# Patient Record
Sex: Male | Born: 1970 | Race: Black or African American | Hispanic: No
Health system: Southern US, Community
[De-identification: ages and names within clinical notes are randomized; demographics above are authoritative.]

## PROBLEM LIST (undated history)

## (undated) DIAGNOSIS — J9621 Acute and chronic respiratory failure with hypoxia: Secondary | ICD-10-CM

## (undated) DIAGNOSIS — N170 Acute kidney failure with tubular necrosis: Secondary | ICD-10-CM

## (undated) DIAGNOSIS — A419 Sepsis, unspecified organism: Secondary | ICD-10-CM

## (undated) DIAGNOSIS — J189 Pneumonia, unspecified organism: Secondary | ICD-10-CM

## (undated) DIAGNOSIS — J80 Acute respiratory distress syndrome: Secondary | ICD-10-CM

## (undated) DIAGNOSIS — U071 COVID-19: Secondary | ICD-10-CM

## (undated) DIAGNOSIS — R6520 Severe sepsis without septic shock: Secondary | ICD-10-CM

---

## 2020-12-02 ENCOUNTER — Other Ambulatory Visit (HOSPITAL_COMMUNITY): Payer: Medicare Other

## 2020-12-02 ENCOUNTER — Institutional Professional Consult (permissible substitution)
Admission: EM | Admit: 2020-12-02 | Discharge: 2021-01-13 | Disposition: A | Discharge status: Skilled Nursing Facility | Payer: Medicare Other | Attending: Internal Medicine | Admitting: Internal Medicine

## 2020-12-02 DIAGNOSIS — A419 Sepsis, unspecified organism: Secondary | ICD-10-CM | POA: Diagnosis present

## 2020-12-02 DIAGNOSIS — J189 Pneumonia, unspecified organism: Secondary | ICD-10-CM | POA: Diagnosis present

## 2020-12-02 DIAGNOSIS — Z931 Gastrostomy status: Secondary | ICD-10-CM

## 2020-12-02 DIAGNOSIS — K567 Ileus, unspecified: Secondary | ICD-10-CM

## 2020-12-02 DIAGNOSIS — J80 Acute respiratory distress syndrome: Secondary | ICD-10-CM | POA: Diagnosis present

## 2020-12-02 DIAGNOSIS — R1310 Dysphagia, unspecified: Secondary | ICD-10-CM

## 2020-12-02 DIAGNOSIS — U071 COVID-19: Secondary | ICD-10-CM | POA: Diagnosis present

## 2020-12-02 DIAGNOSIS — R58 Hemorrhage, not elsewhere classified: Secondary | ICD-10-CM

## 2020-12-02 DIAGNOSIS — R6520 Severe sepsis without septic shock: Secondary | ICD-10-CM | POA: Diagnosis present

## 2020-12-02 DIAGNOSIS — R509 Fever, unspecified: Secondary | ICD-10-CM

## 2020-12-02 DIAGNOSIS — R0603 Acute respiratory distress: Secondary | ICD-10-CM

## 2020-12-02 DIAGNOSIS — T17908A Unspecified foreign body in respiratory tract, part unspecified causing other injury, initial encounter: Secondary | ICD-10-CM

## 2020-12-02 DIAGNOSIS — N170 Acute kidney failure with tubular necrosis: Secondary | ICD-10-CM | POA: Diagnosis present

## 2020-12-02 DIAGNOSIS — D72829 Elevated white blood cell count, unspecified: Secondary | ICD-10-CM

## 2020-12-02 DIAGNOSIS — I509 Heart failure, unspecified: Secondary | ICD-10-CM

## 2020-12-02 DIAGNOSIS — J9621 Acute and chronic respiratory failure with hypoxia: Secondary | ICD-10-CM | POA: Diagnosis present

## 2020-12-02 HISTORY — DX: Pneumonia, unspecified organism: J18.9

## 2020-12-02 HISTORY — DX: Acute and chronic respiratory failure with hypoxia: J96.21

## 2020-12-02 HISTORY — DX: Acute kidney failure with tubular necrosis: N17.0

## 2020-12-02 HISTORY — DX: Sepsis, unspecified organism: A41.9

## 2020-12-02 HISTORY — DX: COVID-19: U07.1

## 2020-12-02 HISTORY — DX: Sepsis, unspecified organism: R65.20

## 2020-12-02 HISTORY — DX: Acute respiratory distress syndrome: J80

## 2020-12-02 MED ORDER — IOPAMIDOL (ISOVUE-300) INJECTION 61%: 25.0000 mL | Freq: Once | INTRAVENOUS | Status: DC | PRN

## 2020-12-03 DIAGNOSIS — N170 Acute kidney failure with tubular necrosis: Secondary | ICD-10-CM

## 2020-12-03 DIAGNOSIS — J189 Pneumonia, unspecified organism: Secondary | ICD-10-CM | POA: Diagnosis not present

## 2020-12-03 DIAGNOSIS — J80 Acute respiratory distress syndrome: Secondary | ICD-10-CM

## 2020-12-03 DIAGNOSIS — J9621 Acute and chronic respiratory failure with hypoxia: Secondary | ICD-10-CM | POA: Diagnosis not present

## 2020-12-03 DIAGNOSIS — A419 Sepsis, unspecified organism: Secondary | ICD-10-CM

## 2020-12-03 DIAGNOSIS — R6520 Severe sepsis without septic shock: Secondary | ICD-10-CM

## 2020-12-03 DIAGNOSIS — U071 COVID-19: Secondary | ICD-10-CM

## 2020-12-03 LAB — CBC
HCT: 32.8 % — ABNORMAL LOW (ref 39.0–52.0)
Hemoglobin: 10.1 g/dL — ABNORMAL LOW (ref 13.0–17.0)
MCH: 27.3 pg (ref 26.0–34.0)
MCHC: 30.8 g/dL (ref 30.0–36.0)
MCV: 88.6 fL (ref 80.0–100.0)
Platelets: 205 10*3/uL (ref 150–400)
RBC: 3.7 MIL/uL — ABNORMAL LOW (ref 4.22–5.81)
RDW: 16.4 % — ABNORMAL HIGH (ref 11.5–15.5)
WBC: 12.7 10*3/uL — ABNORMAL HIGH (ref 4.0–10.5)
nRBC: 0.2 % (ref 0.0–0.2)

## 2020-12-03 LAB — BLOOD GAS, ARTERIAL
Acid-Base Excess: 2.8 mmol/L — ABNORMAL HIGH (ref 0.0–2.0)
Bicarbonate: 26.6 mmol/L (ref 20.0–28.0)
FIO2: 40
O2 Saturation: 98.8 %
Patient temperature: 36.8
pCO2 arterial: 39.2 mmHg (ref 32.0–48.0)
pH, Arterial: 7.445 (ref 7.350–7.450)
pO2, Arterial: 132 mmHg — ABNORMAL HIGH (ref 83.0–108.0)

## 2020-12-03 LAB — BASIC METABOLIC PANEL
Anion gap: 11 (ref 5–15)
BUN: 31 mg/dL — ABNORMAL HIGH (ref 6–20)
CO2: 26 mmol/L (ref 22–32)
Calcium: 8.9 mg/dL (ref 8.9–10.3)
Chloride: 114 mmol/L — ABNORMAL HIGH (ref 98–111)
Creatinine, Ser: 0.78 mg/dL (ref 0.61–1.24)
GFR, Estimated: >60 mL/min (ref >60)
Glucose, Bld: 260 mg/dL — ABNORMAL HIGH (ref 70–99)
Potassium: 4.1 mmol/L (ref 3.5–5.1)
Sodium: 151 mmol/L — ABNORMAL HIGH (ref 135–145)

## 2020-12-03 LAB — PROTIME-INR
INR: 1.5 — ABNORMAL HIGH (ref 0.8–1.2)
Prothrombin Time: 17.3 seconds — ABNORMAL HIGH (ref 11.4–15.2)

## 2020-12-03 NOTE — Consult Note (Signed)
Pulmonary Critical Care Medicine [facility] [place]  PULMONARY SERVICE  Date of Service: 12/03/2020  PULMONARY CRITICAL CARE CONSULT   Charles Levy.  [id number]  DOB: 08/15/1971   DOA: 12/02/2020  Referring Physician: Carron Curie, MD  HPI: Charles Levy. is a 50 y.o. male seen for follow up of Acute on Chronic Respiratory Failure.  Patient has multiple medical problems including asthma anxiety depression diabetes hepatitis C hypertension schizophrenia who had 2 shots COVID vaccine but did not get the booster was admitted to the hospital with COVID-19.  Patient had worsening of respiratory status and ended up on the ventilator mechanical ventilation prolonged period of time was not able to wean had to have a tracheostomy.  Patient received prone position ventilator and also was sedated with propofol fentanyl for sedation.  Prior to coming here the started with pressure support weaning and transferred to our facility now for further management  Review of Systems:  ROS performed and is unremarkable other than noted above.  PAST MEDICAL HISTORY Past Medical History:  Diagnosis Date  . Anxiety  . Asthma  . Bipolar 1 disorder (HCC)  . Depression  . Diabetes mellitus (HCC)  . Hepatitis B  . Hypertension  . Psychosis (HCC)  . Schizophrenia (HCC)  . Substance abuse (HCC)    PAST SURGICAL HISTORY Past Surgical History:  Procedure Laterality Date  . HX ANKLE SURGERY Right 10/2016  . HX FREE SKIN GRAFT   ALLERGIES No Known Allergies   FAMILY HISTORY Family History  Problem Relation Name Age of Onset  . No Known Problems Father  . Hypertension Mother  . Parkinson's Disease Sister  . Seizures Sister   SOCIAL HISTORY Social History   Tobacco Use  . Smoking status: Current Every Day Smoker  Packs/day: 1.00  Years: 5.00  Pack years: 5.00  Types: E-Cigarette/Mist Inhalation Device, Cigarettes  . Smokeless tobacco: Current User  . Tobacco comment:  Counseling given pt vapes  Substance Use Topics  . Alcohol use: Not Currently  Comment: former alcoholic  . 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 General: Comfortable at this time . Eyes: Grossly normal lids, irises & conjunctiva . ENT: grossly tongue is normal . Neck: no obvious mass . Cardiovascular: S1-S2 normal no gallop or rub . Respiratory: Scattered rhonchi very coarse breath sounds . Abdomen: Soft and nontender . Skin: no rash seen on limited exam . Musculoskeletal: not rigid . Psychiatric:unable to assess . Neurologic: no seizure no involuntary movements         Labs on Admission:  Basic Metabolic Panel: Recent Labs  Lab 12/03/20 0140  NA 151*  K 4.1  CL 114*  CO2 26  GLUCOSE 260*  BUN 31*  CREATININE 0.78  CALCIUM 8.9    Recent Labs  Lab 12/03/20 0140  PHART 7.445  PCO2ART 39.2  PO2ART 132*  HCO3 26.6  O2SAT 98.8    Liver Function Tests: No results for input(s): AST, ALT, ALKPHOS, BILITOT, PROT, ALBUMIN in the last 168 hours. No results for input(s): LIPASE, AMYLASE in the last 168 hours. No results for input(s): AMMONIA in the last 168 hours.  CBC: Recent Labs  Lab 12/03/20 0418  WBC 12.7*  HGB 10.1*  HCT 32.8*  MCV 88.6  PLT 205    Cardiac Enzymes: No results for input(s): CKTOTAL, CKMB, CKMBINDEX, TROPONINI in the last 168 hours.  BNP (last 3 results) No results for input(s): BNP in the last 8760 hours.  ProBNP (last 3 results) No results for input(s): PROBNP in the last 8760 hours.   Radiological Exams on Admission: DG ABDOMEN PEG TUBE LOCATION  Result Date: 12/02/2020 CLINICAL DATA:  Peg placement EXAM: ABDOMEN - 1 VIEW COMPARISON:   None. FINDINGS: Orogastric or nasogastric tube enters the stomach. Peg is in place in the proximal body. Contrast is injected which flows into the fundus. No sign of extravasation. IMPRESSION: Orogastric or nasogastric tube well positioned. Peg tube in place in the proximal body. No sign of extravasation following contrast injection. Electronically Signed   By: Paulina Fusi M.D.   On: 12/02/2020 21:50    Assessment/Plan Active Problems:   Acute on chronic respiratory failure with hypoxia (HCC)   COVID-19 virus infection   Severe sepsis (HCC)   Acute renal failure due to tubular necrosis (HCC)   Healthcare-associated pneumonia   Acute respiratory distress syndrome (ARDS) due to COVID-19 virus (HCC)   1. Acute on chronic respiratory failure hypoxia patient right now is on the ventilator full support and assist control mode plan is going to be to wean FiO2 down and also to wean nonprotocol with trial on pressure support wean. 2. COVID-19 virus infection now is in recovery has residual pulmonary damage noted 3. Severe sepsis and shock resolved hemodynamics are stable we will continue to follow along. 4. Acute renal failure we will continue to follow the patient's labs closely. 5. Healthcare associated pneumonia has been treated with antibiotics 6. ARDS severe pulmonary damage post Covid  I have personally seen and evaluated the patient, evaluated laboratory and imaging results, formulated the assessment and plan and placed orders. The Patient requires high complexity decision making with multiple systems involvement.  Case was discussed on Rounds with the Respiratory Therapy Director and the Respiratory staff Time Spent  [name], MD [facility] Pulmonary Critical Care Medicine Sleep Medicine

## 2020-12-04 LAB — BASIC METABOLIC PANEL
Anion gap: 11 (ref 5–15)
BUN: 31 mg/dL — ABNORMAL HIGH (ref 6–20)
CO2: 26 mmol/L (ref 22–32)
Calcium: 9.3 mg/dL (ref 8.9–10.3)
Chloride: 113 mmol/L — ABNORMAL HIGH (ref 98–111)
Creatinine, Ser: 0.66 mg/dL (ref 0.61–1.24)
GFR, Estimated: >60 mL/min (ref >60)
Glucose, Bld: 138 mg/dL — ABNORMAL HIGH (ref 70–99)
Potassium: 3.3 mmol/L — ABNORMAL LOW (ref 3.5–5.1)
Sodium: 150 mmol/L — ABNORMAL HIGH (ref 135–145)

## 2020-12-05 DIAGNOSIS — J189 Pneumonia, unspecified organism: Secondary | ICD-10-CM | POA: Diagnosis not present

## 2020-12-05 DIAGNOSIS — U071 COVID-19: Secondary | ICD-10-CM | POA: Diagnosis not present

## 2020-12-05 DIAGNOSIS — J9621 Acute and chronic respiratory failure with hypoxia: Secondary | ICD-10-CM | POA: Diagnosis not present

## 2020-12-05 DIAGNOSIS — N170 Acute kidney failure with tubular necrosis: Secondary | ICD-10-CM | POA: Diagnosis not present

## 2020-12-05 LAB — BASIC METABOLIC PANEL
Anion gap: 13 (ref 5–15)
BUN: 30 mg/dL — ABNORMAL HIGH (ref 6–20)
CO2: 26 mmol/L (ref 22–32)
Calcium: 9.2 mg/dL (ref 8.9–10.3)
Chloride: 102 mmol/L (ref 98–111)
Creatinine, Ser: 0.66 mg/dL (ref 0.61–1.24)
GFR, Estimated: >60 mL/min (ref >60)
Glucose, Bld: 288 mg/dL — ABNORMAL HIGH (ref 70–99)
Potassium: 3.2 mmol/L — ABNORMAL LOW (ref 3.5–5.1)
Sodium: 141 mmol/L (ref 135–145)

## 2020-12-05 NOTE — Progress Notes (Signed)
Pulmonary Critical Care Medicine [facility] [place]   PULMONARY CRITICAL CARE SERVICE  PROGRESS NOTE  Date of Service: 12/05/2020  Charles Levy.  [id number]  DOB: [birth year]   DOA: 12/02/2020  Referring Physician: Carron Curie, MD  HPI: Charles Hodgkin. is a 50 y.o. male seen for follow up of Acute on Chronic Respiratory Failure.  Patient at this time is on pressure support mode has been on 28% FiO2 currently on a pressure of 12/7  Medications: Reviewed on Rounds  Physical Exam:  Vitals: Temperature 98.9 pulse 88 respiratory 22 blood pressure is 135/82 saturations 99%  Ventilator Settings on pressure support FiO2 is 28% pressure 12/7  . General: Comfortable at this time . Eyes: Grossly normal lids, irises & conjunctiva . ENT: grossly tongue is normal . Neck: no obvious mass . Cardiovascular: S1 S2 normal no gallop . Respiratory: Scattered rhonchi expansion is equal . Abdomen: soft . Skin: no rash seen on limited exam . Musculoskeletal: not rigid . Psychiatric:unable to assess . Neurologic: no seizure no involuntary movements         Lab Data:   Basic Metabolic Panel: Recent Labs  Lab 12/03/20 0140 12/04/20 0507 12/05/20 0520  NA 151* 150* 141  K 4.1 3.3* 3.2*  CL 114* 113* 102  CO2 26 26 26   GLUCOSE 260* 138* 288*  BUN 31* 31* 30*  CREATININE 0.78 0.66 0.66  CALCIUM 8.9 9.3 9.2    ABG: Recent Labs  Lab 12/03/20 0140  PHART 7.445  PCO2ART 39.2  PO2ART 132*  HCO3 26.6  O2SAT 98.8    Liver Function Tests: No results for input(s): AST, ALT, ALKPHOS, BILITOT, PROT, ALBUMIN in the last 168 hours. No results for input(s): LIPASE, AMYLASE in the last 168 hours. No results for input(s): AMMONIA in the last 168 hours.  CBC: Recent Labs  Lab 12/03/20 0418  WBC 12.7*  HGB 10.1*  HCT 32.8*  MCV 88.6  PLT 205    Cardiac Enzymes: No results for input(s): CKTOTAL, CKMB, CKMBINDEX, TROPONINI in the last 168 hours.  BNP (last 3  results) No results for input(s): BNP in the last 8760 hours.  ProBNP (last 3 results) No results for input(s): PROBNP in the last 8760 hours.  Radiological Exams: No results found.  Assessment/Plan Active Problems:   Acute on chronic respiratory failure with hypoxia (HCC)   COVID-19 virus infection   Severe sepsis (HCC)   Acute renal failure due to tubular necrosis (HCC)   Healthcare-associated pneumonia   Acute respiratory distress syndrome (ARDS) due to COVID-19 virus (HCC)   1. Acute on chronic respiratory failure hypoxia we will try to wean on pressure support continue with pressure of 12/7. 2. COVID-19 virus infection recovery 3. Severe sepsis treated resolving hemodynamics stable 4. Acute renal failure following up on labs 5. Healthcare associated pneumonia treated resolving 6. ARDS patient does have severe pulmonary damage post infection   I have personally seen and evaluated the patient, evaluated laboratory and imaging results, formulated the assessment and plan and placed orders. The Patient requires high complexity decision making with multiple systems involvement.  Rounds were done with the Respiratory Therapy Director and Staff therapists and discussed with nursing staff also.  01/31/21, MD [facility] Pulmonary Critical Care Medicine Sleep Medicine

## 2020-12-06 DIAGNOSIS — J9621 Acute and chronic respiratory failure with hypoxia: Secondary | ICD-10-CM | POA: Diagnosis not present

## 2020-12-06 DIAGNOSIS — J189 Pneumonia, unspecified organism: Secondary | ICD-10-CM | POA: Diagnosis not present

## 2020-12-06 DIAGNOSIS — N170 Acute kidney failure with tubular necrosis: Secondary | ICD-10-CM | POA: Diagnosis not present

## 2020-12-06 DIAGNOSIS — U071 COVID-19: Secondary | ICD-10-CM | POA: Diagnosis not present

## 2020-12-06 LAB — POTASSIUM: Potassium: 3.1 mmol/L — ABNORMAL LOW (ref 3.5–5.1)

## 2020-12-06 NOTE — Progress Notes (Signed)
Pulmonary Critical Care Medicine [facility] [place]   PULMONARY CRITICAL CARE SERVICE  PROGRESS NOTE  Date of Service: 12/06/2020  Charles Levy.  [id number]  DOB: 05-29-1971   DOA: 12/02/2020  Referring Physician: Carron Curie, MD  HPI: Charles Levy. is a 50 y.o. male seen for follow up of Acute on Chronic Respiratory Failure.  Patient is on pressure support FiO2 is 28% pressure of 12/5  Medications: Reviewed on Rounds  Physical Exam:  Vitals: Temperature is 98.5 pulse 90 respiratory 25 blood pressure is 123/85 saturations 99%  Ventilator Settings on pressure support FiO2 is 20% pressure 12/5  . General: Comfortable at this time . Eyes: Grossly normal lids, irises & conjunctiva . ENT: grossly tongue is normal . Neck: no obvious mass . Cardiovascular: S1 S2 normal no gallop . Respiratory: Scattered rhonchi very coarse breath sounds . Abdomen: soft . Skin: no rash seen on limited exam . Musculoskeletal: not rigid . Psychiatric:unable to assess . Neurologic: no seizure no involuntary movements         Lab Data:   Basic Metabolic Panel: Recent Labs  Lab 12/03/20 0140 12/04/20 0507 12/05/20 0520 12/06/20 0602  NA 151* 150* 141  --   K 4.1 3.3* 3.2* 3.1*  CL 114* 113* 102  --   CO2 26 26 26   --   GLUCOSE 260* 138* 288*  --   BUN 31* 31* 30*  --   CREATININE 0.78 0.66 0.66  --   CALCIUM 8.9 9.3 9.2  --     ABG: Recent Labs  Lab 12/03/20 0140  PHART 7.445  PCO2ART 39.2  PO2ART 132*  HCO3 26.6  O2SAT 98.8    Liver Function Tests: No results for input(s): AST, ALT, ALKPHOS, BILITOT, PROT, ALBUMIN in the last 168 hours. No results for input(s): LIPASE, AMYLASE in the last 168 hours. No results for input(s): AMMONIA in the last 168 hours.  CBC: Recent Labs  Lab 12/03/20 0418  WBC 12.7*  HGB 10.1*  HCT 32.8*  MCV 88.6  PLT 205    Cardiac Enzymes: No results for input(s): CKTOTAL, CKMB, CKMBINDEX, TROPONINI in the last 168  hours.  BNP (last 3 results) No results for input(s): BNP in the last 8760 hours.  ProBNP (last 3 results) No results for input(s): PROBNP in the last 8760 hours.  Radiological Exams: No results found.  Assessment/Plan Active Problems:   Acute on chronic respiratory failure with hypoxia (HCC)   COVID-19 virus infection   Severe sepsis (HCC)   Acute renal failure due to tubular necrosis (HCC)   Healthcare-associated pneumonia   Acute respiratory distress syndrome (ARDS) due to COVID-19 virus (HCC)   1. Acute on chronic respiratory failure hypoxia plan is to wean on pressure support 12/5 the goal today will be for about 12 hours of weaning 2. COVID-19 virus infection recovery 3. Severe sepsis treated hemodynamics are stable. 4. Acute renal failure following up on labs 5. Healthcare associated pneumonia treated in resolution 6. ARDS status post COVID-19 pneumonia   I have personally seen and evaluated the patient, evaluated laboratory and imaging results, formulated the assessment and plan and placed orders. The Patient requires high complexity decision making with multiple systems involvement.  Rounds were done with the Respiratory Therapy Director and Staff therapists and discussed with nursing staff also.  14/5, MD [facility] Pulmonary Critical Care Medicine Sleep Medicine

## 2020-12-07 ENCOUNTER — Other Ambulatory Visit (HOSPITAL_COMMUNITY): Payer: Medicare Other

## 2020-12-07 DIAGNOSIS — N170 Acute kidney failure with tubular necrosis: Secondary | ICD-10-CM | POA: Diagnosis not present

## 2020-12-07 DIAGNOSIS — J9621 Acute and chronic respiratory failure with hypoxia: Secondary | ICD-10-CM | POA: Diagnosis not present

## 2020-12-07 DIAGNOSIS — J189 Pneumonia, unspecified organism: Secondary | ICD-10-CM | POA: Diagnosis not present

## 2020-12-07 DIAGNOSIS — U071 COVID-19: Secondary | ICD-10-CM | POA: Diagnosis not present

## 2020-12-07 LAB — POTASSIUM: Potassium: 3.4 mmol/L — ABNORMAL LOW (ref 3.5–5.1)

## 2020-12-07 NOTE — Progress Notes (Signed)
Pulmonary Critical Care Medicine [facility] [place]   PULMONARY CRITICAL CARE SERVICE  PROGRESS NOTE  Date of Service: 12/07/2020  Charles Levy.  [id number]  DOB: Dec 11, 1970   DOA: 12/02/2020  Referring Physician: Carron Curie, MD  HPI: Charles Levy. is a 50 y.o. male seen for follow up of Acute on Chronic Respiratory Failure.  At this time patient is on assist control mode with a goal of 16 hours on pressure support  Medications: Reviewed on Rounds  Physical Exam:  Vitals: Temperature is 99.0 pulse 92 respiratory 34 blood pressure is 122/73 saturations 96%  Ventilator Settings on assist control FiO2 28% tidal volume is 500 PEEP 5  . General: Comfortable at this time . Eyes: Grossly normal lids, irises & conjunctiva . ENT: grossly tongue is normal . Neck: no obvious mass . Cardiovascular: S1 S2 normal no gallop . Respiratory: Scattered rhonchi expansion is equal . Abdomen: soft . Skin: no rash seen on limited exam . Musculoskeletal: not rigid . Psychiatric:unable to assess . Neurologic: no seizure no involuntary movements         Lab Data:   Basic Metabolic Panel: Recent Labs  Lab 12/03/20 0140 12/04/20 0507 12/05/20 0520 12/06/20 0602 12/07/20 0352  NA 151* 150* 141  --   --   K 4.1 3.3* 3.2* 3.1* 3.4*  CL 114* 113* 102  --   --   CO2 26 26 26   --   --   GLUCOSE 260* 138* 288*  --   --   BUN 31* 31* 30*  --   --   CREATININE 0.78 0.66 0.66  --   --   CALCIUM 8.9 9.3 9.2  --   --     ABG: Recent Labs  Lab 12/03/20 0140  PHART 7.445  PCO2ART 39.2  PO2ART 132*  HCO3 26.6  O2SAT 98.8    Liver Function Tests: No results for input(s): AST, ALT, ALKPHOS, BILITOT, PROT, ALBUMIN in the last 168 hours. No results for input(s): LIPASE, AMYLASE in the last 168 hours. No results for input(s): AMMONIA in the last 168 hours.  CBC: Recent Labs  Lab 12/03/20 0418  WBC 12.7*  HGB 10.1*  HCT 32.8*  MCV 88.6  PLT 205    Cardiac  Enzymes: No results for input(s): CKTOTAL, CKMB, CKMBINDEX, TROPONINI in the last 168 hours.  BNP (last 3 results) No results for input(s): BNP in the last 8760 hours.  ProBNP (last 3 results) No results for input(s): PROBNP in the last 8760 hours.  Radiological Exams: DG Abd Portable 1V  Result Date: 12/07/2020 CLINICAL DATA:  Ileus EXAM: PORTABLE ABDOMEN - 1 VIEW COMPARISON:  Five days ago FINDINGS: Diffuse colonic gas which is progressive. The ascending colon measures up to 10 cm in diameter. An enteric tube and percutaneous gastrostomy tube is seen over the stomach. The epigastrium is partially covered. No concerning mass effect or gas collection. IMPRESSION: Findings of colonic ileus with progressive distension since 5 days ago. Electronically Signed   By: 12/09/2020 M.D.   On: 12/07/2020 06:09    Assessment/Plan Active Problems:   Acute on chronic respiratory failure with hypoxia (HCC)   COVID-19 virus infection   Severe sepsis (HCC)   Acute renal failure due to tubular necrosis (HCC)   Healthcare-associated pneumonia   Acute respiratory distress syndrome (ARDS) due to COVID-19 virus (HCC)   1. Acute on chronic respiratory failure with hypoxia we will continue with the weaning protocol for pressure support  16 hours today 2. Severe sepsis hemodynamics are stable 3. COVID-19 virus infection resolution 4. Healthcare associated pneumonia patient's x-ray still showing residual pulmonary damage 5. ARDS secondary to COVID-19 6. Acute renal failure following up on labs   I have personally seen and evaluated the patient, evaluated laboratory and imaging results, formulated the assessment and plan and placed orders. The Patient requires high complexity decision making with multiple systems involvement.  Rounds were done with the Respiratory Therapy Director and Staff therapists and discussed with nursing staff also.  [name], MD [facility] Pulmonary Critical Care  Medicine Sleep Medicine

## 2020-12-08 ENCOUNTER — Other Ambulatory Visit (HOSPITAL_COMMUNITY): Payer: Medicare Other

## 2020-12-08 DIAGNOSIS — J9621 Acute and chronic respiratory failure with hypoxia: Secondary | ICD-10-CM | POA: Diagnosis not present

## 2020-12-08 DIAGNOSIS — J189 Pneumonia, unspecified organism: Secondary | ICD-10-CM | POA: Diagnosis not present

## 2020-12-08 DIAGNOSIS — N170 Acute kidney failure with tubular necrosis: Secondary | ICD-10-CM | POA: Diagnosis not present

## 2020-12-08 DIAGNOSIS — U071 COVID-19: Secondary | ICD-10-CM | POA: Diagnosis not present

## 2020-12-08 LAB — BASIC METABOLIC PANEL
Anion gap: 10 (ref 5–15)
BUN: 12 mg/dL (ref 6–20)
CO2: 25 mmol/L (ref 22–32)
Calcium: 8.3 mg/dL — ABNORMAL LOW (ref 8.9–10.3)
Chloride: 104 mmol/L (ref 98–111)
Creatinine, Ser: 0.6 mg/dL — ABNORMAL LOW (ref 0.61–1.24)
GFR, Estimated: >60 mL/min (ref >60)
Glucose, Bld: 141 mg/dL — ABNORMAL HIGH (ref 70–99)
Potassium: 2.7 mmol/L — CL (ref 3.5–5.1)
Sodium: 139 mmol/L (ref 135–145)

## 2020-12-08 LAB — MAGNESIUM: Magnesium: 1.8 mg/dL (ref 1.7–2.4)

## 2020-12-08 NOTE — Progress Notes (Signed)
Pulmonary Critical Care Medicine [facility] [place]   PULMONARY CRITICAL CARE SERVICE  PROGRESS NOTE  Date of Service: 12/08/2020  Charles Levy.  [id number]  DOB: [birth year]   DOA: 12/02/2020  Referring Physician: Carron Curie, MD  HPI: Charles Manson. is a 50 y.o. male seen for follow up of Acute on Chronic Respiratory Failure.  Patient at this time is on pressure support has been on a pressure of 12/5  Medications: Reviewed on Rounds  Physical Exam:  Vitals: Temperature 98.5 pulse 75 respiratory rate 24 blood pressure is 112/70 saturations 98%  Ventilator Settings on pressure support 12/5  . General: Comfortable at this time . Eyes: Grossly normal lids, irises & conjunctiva . ENT: grossly tongue is normal . Neck: no obvious mass . Cardiovascular: S1 S2 normal no gallop . Respiratory: No rhonchi coarse breath sounds . Abdomen: soft . Skin: no rash seen on limited exam . Musculoskeletal: not rigid . Psychiatric:unable to assess . Neurologic: no seizure no involuntary movements         Lab Data:   Basic Metabolic Panel: Recent Labs  Lab 12/03/20 0140 12/04/20 0507 12/05/20 0520 12/06/20 0602 12/07/20 0352 12/08/20 0427  NA 151* 150* 141  --   --  139  K 4.1 3.3* 3.2* 3.1* 3.4* 2.7*  CL 114* 113* 102  --   --  104  CO2 26 26 26   --   --  25  GLUCOSE 260* 138* 288*  --   --  141*  BUN 31* 31* 30*  --   --  12  CREATININE 0.78 0.66 0.66  --   --  0.60*  CALCIUM 8.9 9.3 9.2  --   --  8.3*  MG  --   --   --   --   --  1.8    ABG: Recent Labs  Lab 12/03/20 0140  PHART 7.445  PCO2ART 39.2  PO2ART 132*  HCO3 26.6  O2SAT 98.8    Liver Function Tests: No results for input(s): AST, ALT, ALKPHOS, BILITOT, PROT, ALBUMIN in the last 168 hours. No results for input(s): LIPASE, AMYLASE in the last 168 hours. No results for input(s): AMMONIA in the last 168 hours.  CBC: Recent Labs  Lab 12/03/20 0418  WBC 12.7*  HGB 10.1*  HCT 32.8*   MCV 88.6  PLT 205    Cardiac Enzymes: No results for input(s): CKTOTAL, CKMB, CKMBINDEX, TROPONINI in the last 168 hours.  BNP (last 3 results) No results for input(s): BNP in the last 8760 hours.  ProBNP (last 3 results) No results for input(s): PROBNP in the last 8760 hours.  Radiological Exams: DG Abd 1 View  Result Date: 12/08/2020 CLINICAL DATA:  Ileus. EXAM: ABDOMEN - 1 VIEW COMPARISON:  12/07/2020. FINDINGS: Persistent colonic distention again noted without significant interim change. Associated small bowel distention may be present. Again these findings are most consistent with ileus. Continued follow-up exam suggested to demonstrate resolution and to exclude bowel obstruction. No free air identified. Degenerative changes lumbar spine and both hips. Pelvic calcifications consistent phleboliths. IMPRESSION: Persistent colonic distention again noted without significant interim change. Associated small bowel distention may be present. Again these findings are most consistent with ileus. Continued follow-up exam suggested to demonstrate resolution and to exclude bowel obstruction. Electronically Signed   By: 12/09/2020  Register   On: 12/08/2020 06:17   DG Abd Portable 1V  Result Date: 12/07/2020 CLINICAL DATA:  Ileus EXAM: PORTABLE ABDOMEN - 1 VIEW COMPARISON:  Five days ago FINDINGS: Diffuse colonic gas which is progressive. The ascending colon measures up to 10 cm in diameter. An enteric tube and percutaneous gastrostomy tube is seen over the stomach. The epigastrium is partially covered. No concerning mass effect or gas collection. IMPRESSION: Findings of colonic ileus with progressive distension since 5 days ago. Electronically Signed   By: Marnee Spring M.D.   On: 12/07/2020 06:09    Assessment/Plan Active Problems:   Acute on chronic respiratory failure with hypoxia (HCC)   COVID-19 virus infection   Severe sepsis (HCC)   Acute renal failure due to tubular necrosis (HCC)    Healthcare-associated pneumonia   Acute respiratory distress syndrome (ARDS) due to COVID-19 virus (HCC)   1. Acute on chronic respiratory failure hypoxia plan is to continue with the weaning on pressure support.  Continue to titrate as tolerated we will continue to follow along. 2. COVID-19 virus infection recovery phase 3. Severe sepsis treated in resolution 4. ATN patient is doing better from a renal perspective 5. Healthcare associated pneumonia treated 6. ARDS secondary to COVID-19   I have personally seen and evaluated the patient, evaluated laboratory and imaging results, formulated the assessment and plan and placed orders. The Patient requires high complexity decision making with multiple systems involvement.  Rounds were done with the Respiratory Therapy Director and Staff therapists and discussed with nursing staff also.  [name], MD [facility] Pulmonary Critical Care Medicine Sleep Medicine

## 2020-12-09 ENCOUNTER — Other Ambulatory Visit (HOSPITAL_COMMUNITY): Payer: Medicare Other

## 2020-12-09 DIAGNOSIS — N170 Acute kidney failure with tubular necrosis: Secondary | ICD-10-CM | POA: Diagnosis not present

## 2020-12-09 DIAGNOSIS — U071 COVID-19: Secondary | ICD-10-CM | POA: Diagnosis not present

## 2020-12-09 DIAGNOSIS — J9621 Acute and chronic respiratory failure with hypoxia: Secondary | ICD-10-CM | POA: Diagnosis not present

## 2020-12-09 DIAGNOSIS — J189 Pneumonia, unspecified organism: Secondary | ICD-10-CM | POA: Diagnosis not present

## 2020-12-09 LAB — POTASSIUM: Potassium: 3.2 mmol/L — ABNORMAL LOW (ref 3.5–5.1)

## 2020-12-09 NOTE — Progress Notes (Signed)
Pulmonary Critical Care Medicine [facility] [place]   PULMONARY CRITICAL CARE SERVICE  PROGRESS NOTE  Date of Service: 12/09/2020  Charles Levy.  AJG:[id number]  DOB: July 26, 1971   DOA: 12/02/2020  Referring Physician: Carron Curie, MD  HPI: Charles Levy. is a 50 y.o. male seen for follow up of Acute on Chronic Respiratory Failure.  Patient is weaning on T collar today's goal is up to 12 hours  Medications: Reviewed on Rounds  Physical Exam:  Vitals: Temperature is 98.7 pulse 80 respiratory rate is 16 blood pressure is 142/83 saturations 100%  Ventilator Settings on T collar with an FiO2 of 28%  . General: Comfortable at this time . Eyes: Grossly normal lids, irises & conjunctiva . ENT: grossly tongue is normal . Neck: no obvious mass . Cardiovascular: S1 S2 normal no gallop . Respiratory: Scattered rhonchi expansion is equal . Abdomen: soft . Skin: no rash seen on limited exam . Musculoskeletal: not rigid . Psychiatric:unable to assess . Neurologic: no seizure no involuntary movements         Lab Data:   Basic Metabolic Panel: Recent Labs  Lab 12/03/20 0140 12/04/20 0507 12/05/20 0520 12/06/20 0602 12/07/20 0352 12/08/20 0427  NA 151* 150* 141  --   --  139  K 4.1 3.3* 3.2* 3.1* 3.4* 2.7*  CL 114* 113* 102  --   --  104  CO2 26 26 26   --   --  25  GLUCOSE 260* 138* 288*  --   --  141*  BUN 31* 31* 30*  --   --  12  CREATININE 0.78 0.66 0.66  --   --  0.60*  CALCIUM 8.9 9.3 9.2  --   --  8.3*  MG  --   --   --   --   --  1.8    ABG: Recent Labs  Lab 12/03/20 0140  PHART 7.445  PCO2ART 39.2  PO2ART 132*  HCO3 26.6  O2SAT 98.8    Liver Function Tests: No results for input(s): AST, ALT, ALKPHOS, BILITOT, PROT, ALBUMIN in the last 168 hours. No results for input(s): LIPASE, AMYLASE in the last 168 hours. No results for input(s): AMMONIA in the last 168 hours.  CBC: Recent Labs  Lab 12/03/20 0418  WBC 12.7*  HGB 10.1*  HCT  32.8*  MCV 88.6  PLT 205    Cardiac Enzymes: No results for input(s): CKTOTAL, CKMB, CKMBINDEX, TROPONINI in the last 168 hours.  BNP (last 3 results) No results for input(s): BNP in the last 8760 hours.  ProBNP (last 3 results) No results for input(s): PROBNP in the last 8760 hours.  Radiological Exams: DG Abd 1 View  Result Date: 12/09/2020 CLINICAL DATA:  Ileus. EXAM: ABDOMEN - 1 VIEW COMPARISON:  12/08/2020. FINDINGS: Gastrostomy tube noted with tip over the stomach. Persistent unchanged colonic distention again noted. Associated small bowel distention may also be present. Again these findings are consistent adynamic ileus. Continued follow-up exam suggested demonstrate resolution in order to exclude bowel obstruction. No free air identified. Pelvic calcifications consistent phleboliths. No acute bony abnormality. IMPRESSION: 1. Gastrostomy tube noted with tip over the stomach. 2. Persistent unchanged colonic distention again noted. Associated small bowel distention may also be present. Again these findings are consistent with adynamic ileus. Electronically Signed   By: 12/10/2020  Register   On: 12/09/2020 05:33   DG Abd 1 View  Result Date: 12/08/2020 CLINICAL DATA:  Ileus. EXAM: ABDOMEN - 1 VIEW COMPARISON:  12/07/2020. FINDINGS: Persistent colonic distention again noted without significant interim change. Associated small bowel distention may be present. Again these findings are most consistent with ileus. Continued follow-up exam suggested to demonstrate resolution and to exclude bowel obstruction. No free air identified. Degenerative changes lumbar spine and both hips. Pelvic calcifications consistent phleboliths. IMPRESSION: Persistent colonic distention again noted without significant interim change. Associated small bowel distention may be present. Again these findings are most consistent with ileus. Continued follow-up exam suggested to demonstrate resolution and to exclude bowel  obstruction. Electronically Signed   By: Maisie Fus  Register   On: 12/08/2020 06:17    Assessment/Plan Active Problems:   Acute on chronic respiratory failure with hypoxia (HCC)   COVID-19 virus infection   Severe sepsis (HCC)   Acute renal failure due to tubular necrosis (HCC)   Healthcare-associated pneumonia   Acute respiratory distress syndrome (ARDS) due to COVID-19 virus (HCC)   1. Acute on chronic respiratory failure hypoxia we will continue with T collar trials titrate oxygen continue pulmonary toilet.  The goal today is 12 hours 2. Severe sepsis resolved hemodynamics are stable. 3. Acute renal failure due to necrosis no change we will continue to follow 4. Healthcare associated pneumonia improving clinically. 5. ARDS secondary to COVID-19 treated 6. Acute renal failure supportive care   I have personally seen and evaluated the patient, evaluated laboratory and imaging results, formulated the assessment and plan and placed orders. The Patient requires high complexity decision making with multiple systems involvement.  Rounds were done with the Respiratory Therapy Director and Staff therapists and discussed with nursing staff also.  [name], MD [facility] Pulmonary Critical Care Medicine Sleep Medicine

## 2020-12-10 DIAGNOSIS — U071 COVID-19: Secondary | ICD-10-CM | POA: Diagnosis not present

## 2020-12-10 DIAGNOSIS — J189 Pneumonia, unspecified organism: Secondary | ICD-10-CM | POA: Diagnosis not present

## 2020-12-10 DIAGNOSIS — J9621 Acute and chronic respiratory failure with hypoxia: Secondary | ICD-10-CM | POA: Diagnosis not present

## 2020-12-10 DIAGNOSIS — N170 Acute kidney failure with tubular necrosis: Secondary | ICD-10-CM | POA: Diagnosis not present

## 2020-12-10 LAB — BASIC METABOLIC PANEL
Anion gap: 10 (ref 5–15)
BUN: 12 mg/dL (ref 6–20)
CO2: 24 mmol/L (ref 22–32)
Calcium: 8.7 mg/dL — ABNORMAL LOW (ref 8.9–10.3)
Chloride: 104 mmol/L (ref 98–111)
Creatinine, Ser: 0.52 mg/dL — ABNORMAL LOW (ref 0.61–1.24)
GFR, Estimated: >60 mL/min (ref >60)
Glucose, Bld: 108 mg/dL — ABNORMAL HIGH (ref 70–99)
Potassium: 3.4 mmol/L — ABNORMAL LOW (ref 3.5–5.1)
Sodium: 138 mmol/L (ref 135–145)

## 2020-12-10 LAB — CBC
HCT: 33.8 % — ABNORMAL LOW (ref 39.0–52.0)
Hemoglobin: 10.6 g/dL — ABNORMAL LOW (ref 13.0–17.0)
MCH: 27.8 pg (ref 26.0–34.0)
MCHC: 31.4 g/dL (ref 30.0–36.0)
MCV: 88.7 fL (ref 80.0–100.0)
Platelets: 344 10*3/uL (ref 150–400)
RBC: 3.81 MIL/uL — ABNORMAL LOW (ref 4.22–5.81)
RDW: 18.6 % — ABNORMAL HIGH (ref 11.5–15.5)
WBC: 7.4 10*3/uL (ref 4.0–10.5)
nRBC: 0 % (ref 0.0–0.2)

## 2020-12-10 NOTE — Progress Notes (Signed)
Pulmonary Critical Care Medicine [facility] [place]   PULMONARY CRITICAL CARE SERVICE  PROGRESS NOTE  Date of Service: 12/10/2020  Charles Levy.  IHK:[id number]  DOB: [birth year]   DOA: 12/02/2020  Referring Physician: Carron Curie, MD  HPI: Charles Hubbert. is a 50 y.o. male seen for follow up of Acute on Chronic Respiratory Failure.  Patient is on pressure support currently on 28% FiO2 goal today is T collar 16 hours  Medications: Reviewed on Rounds  Physical Exam:  Vitals: Temperature is 98.3 pulse 88 respiratory 15 blood pressure is 129/80 saturations 97%  Ventilator Settings on pressure support FiO2 is 28% pressure 12/5  . General: Comfortable at this time . Eyes: Grossly normal lids, irises & conjunctiva . ENT: grossly tongue is normal . Neck: no obvious mass . Cardiovascular: S1 S2 normal no gallop . Respiratory: Scattered rhonchi expansion is equal . Abdomen: soft . Skin: no rash seen on limited exam . Musculoskeletal: not rigid . Psychiatric:unable to assess . Neurologic: no seizure no involuntary movements         Lab Data:   Basic Metabolic Panel: Recent Labs  Lab 12/04/20 0507 12/05/20 0520 12/06/20 0602 12/07/20 0352 12/08/20 0427 12/09/20 0855 12/10/20 0458  NA 150* 141  --   --  139  --  138  K 3.3* 3.2* 3.1* 3.4* 2.7* 3.2* 3.4*  CL 113* 102  --   --  104  --  104  CO2 26 26  --   --  25  --  24  GLUCOSE 138* 288*  --   --  141*  --  108*  BUN 31* 30*  --   --  12  --  12  CREATININE 0.66 0.66  --   --  0.60*  --  0.52*  CALCIUM 9.3 9.2  --   --  8.3*  --  8.7*  MG  --   --   --   --  1.8  --   --     ABG: No results for input(s): PHART, PCO2ART, PO2ART, HCO3, O2SAT in the last 168 hours.  Liver Function Tests: No results for input(s): AST, ALT, ALKPHOS, BILITOT, PROT, ALBUMIN in the last 168 hours. No results for input(s): LIPASE, AMYLASE in the last 168 hours. No results for input(s): AMMONIA in the last 168  hours.  CBC: Recent Labs  Lab 12/10/20 0458  WBC 7.4  HGB 10.6*  HCT 33.8*  MCV 88.7  PLT 344    Cardiac Enzymes: No results for input(s): CKTOTAL, CKMB, CKMBINDEX, TROPONINI in the last 168 hours.  BNP (last 3 results) No results for input(s): BNP in the last 8760 hours.  ProBNP (last 3 results) No results for input(s): PROBNP in the last 8760 hours.  Radiological Exams: DG Abd 1 View  Result Date: 12/09/2020 CLINICAL DATA:  Ileus. EXAM: ABDOMEN - 1 VIEW COMPARISON:  12/08/2020. FINDINGS: Gastrostomy tube noted with tip over the stomach. Persistent unchanged colonic distention again noted. Associated small bowel distention may also be present. Again these findings are consistent adynamic ileus. Continued follow-up exam suggested demonstrate resolution in order to exclude bowel obstruction. No free air identified. Pelvic calcifications consistent phleboliths. No acute bony abnormality. IMPRESSION: 1. Gastrostomy tube noted with tip over the stomach. 2. Persistent unchanged colonic distention again noted. Associated small bowel distention may also be present. Again these findings are consistent with adynamic ileus. Electronically Signed   By: Maisie Fus  Register   On: 12/09/2020 05:33  Assessment/Plan Active Problems:   Acute on chronic respiratory failure with hypoxia (HCC)   COVID-19 virus infection   Severe sepsis (HCC)   Acute renal failure due to tubular necrosis (HCC)   Healthcare-associated pneumonia   Acute respiratory distress syndrome (ARDS) due to COVID-19 virus (HCC)   1. Acute on chronic respiratory failure with hypoxia we will continue with the weaning protocol patient is currently in for 16 hours 2. Severe sepsis and shock hemodynamics are stable. 3. Acute renal failure tubular necrosis need to follow along. 4. COVID-19 virus infection in recovery 5. Healthcare associated pneumonia treated 6. ARDS treated we will continue to follow   I have personally seen  and evaluated the patient, evaluated laboratory and imaging results, formulated the assessment and plan and placed orders. The Patient requires high complexity decision making with multiple systems involvement.  Rounds were done with the Respiratory Therapy Director and Staff therapists and discussed with nursing staff also.  [name], MD [facility] Pulmonary Critical Care Medicine Sleep Medicine

## 2020-12-11 ENCOUNTER — Other Ambulatory Visit (HOSPITAL_COMMUNITY): Payer: Medicare Other

## 2020-12-11 ENCOUNTER — Encounter: Payer: Self-pay | Admitting: Internal Medicine

## 2020-12-11 DIAGNOSIS — J9621 Acute and chronic respiratory failure with hypoxia: Secondary | ICD-10-CM | POA: Diagnosis present

## 2020-12-11 DIAGNOSIS — A419 Sepsis, unspecified organism: Secondary | ICD-10-CM | POA: Diagnosis present

## 2020-12-11 DIAGNOSIS — U071 COVID-19: Secondary | ICD-10-CM | POA: Diagnosis present

## 2020-12-11 DIAGNOSIS — J80 Acute respiratory distress syndrome: Secondary | ICD-10-CM | POA: Diagnosis present

## 2020-12-11 DIAGNOSIS — N170 Acute kidney failure with tubular necrosis: Secondary | ICD-10-CM | POA: Diagnosis present

## 2020-12-11 DIAGNOSIS — J189 Pneumonia, unspecified organism: Secondary | ICD-10-CM | POA: Diagnosis present

## 2020-12-11 LAB — CBC
HCT: 30.8 % — ABNORMAL LOW (ref 39.0–52.0)
Hemoglobin: 10 g/dL — ABNORMAL LOW (ref 13.0–17.0)
MCH: 28.3 pg (ref 26.0–34.0)
MCHC: 32.5 g/dL (ref 30.0–36.0)
MCV: 87.3 fL (ref 80.0–100.0)
Platelets: 358 10*3/uL (ref 150–400)
RBC: 3.53 MIL/uL — ABNORMAL LOW (ref 4.22–5.81)
RDW: 19.2 % — ABNORMAL HIGH (ref 11.5–15.5)
WBC: 7.1 10*3/uL (ref 4.0–10.5)
nRBC: 0 % (ref 0.0–0.2)

## 2020-12-11 LAB — COMPREHENSIVE METABOLIC PANEL
ALT: 32 U/L (ref 0–44)
AST: 32 U/L (ref 15–41)
Albumin: 2.2 g/dL — ABNORMAL LOW (ref 3.5–5.0)
Alkaline Phosphatase: 106 U/L (ref 38–126)
Anion gap: 12 (ref 5–15)
BUN: 8 mg/dL (ref 6–20)
CO2: 22 mmol/L (ref 22–32)
Calcium: 8.5 mg/dL — ABNORMAL LOW (ref 8.9–10.3)
Chloride: 104 mmol/L (ref 98–111)
Creatinine, Ser: 0.5 mg/dL — ABNORMAL LOW (ref 0.61–1.24)
GFR, Estimated: >60 mL/min (ref >60)
Glucose, Bld: 145 mg/dL — ABNORMAL HIGH (ref 70–99)
Potassium: 3.7 mmol/L (ref 3.5–5.1)
Sodium: 138 mmol/L (ref 135–145)
Total Bilirubin: 0.7 mg/dL (ref 0.3–1.2)
Total Protein: 5.6 g/dL — ABNORMAL LOW (ref 6.5–8.1)

## 2020-12-11 NOTE — Progress Notes (Signed)
Pulmonary Critical Care Medicine [facility] [place]   PULMONARY CRITICAL CARE SERVICE  PROGRESS NOTE  Date of Service: 12/11/2020  [name]  VOJ:[id number]  DOB: December 27, 1970   DOA: 12/02/2020  Referring Physician: Carron Curie, MD  HPI: Bernabe Dorce. is a 50 y.o. male seen for follow up of Acute on Chronic Respiratory Failure.  Patient is currently on T collar today the goal is for 20 hours  Medications: Reviewed on Rounds  Physical Exam:  Vitals: Temperature is 97.3 pulse 96 respiratory 27 blood pressure is 158/87 saturations 99%  Ventilator Settings T collar 28%  . General: Comfortable at this time . Eyes: Grossly normal lids, irises & conjunctiva . ENT: grossly tongue is normal . Neck: no obvious mass . Cardiovascular: S1 S2 normal no gallop . Respiratory: No rhonchi no rales are noted at this time . Abdomen: soft . Skin: no rash seen on limited exam . Musculoskeletal: not rigid . Psychiatric:unable to assess . Neurologic: no seizure no involuntary movements         Lab Data:   Basic Metabolic Panel: Recent Labs  Lab 12/05/20 0520 12/06/20 0602 12/07/20 0352 12/08/20 0427 12/09/20 0855 12/10/20 0458 12/11/20 0544  NA 141  --   --  139  --  138 138  K 3.2*   < > 3.4* 2.7* 3.2* 3.4* 3.7  CL 102  --   --  104  --  104 104  CO2 26  --   --  25  --  24 22  GLUCOSE 288*  --   --  141*  --  108* 145*  BUN 30*  --   --  12  --  12 8  CREATININE 0.66  --   --  0.60*  --  0.52* 0.50*  CALCIUM 9.2  --   --  8.3*  --  8.7* 8.5*  MG  --   --   --  1.8  --   --   --    < > = values in this interval not displayed.    ABG: No results for input(s): PHART, PCO2ART, PO2ART, HCO3, O2SAT in the last 168 hours.  Liver Function Tests: Recent Labs  Lab 12/11/20 0544  AST 32  ALT 32  ALKPHOS 106  BILITOT 0.7  PROT 5.6*  ALBUMIN 2.2*   No results for input(s): LIPASE, AMYLASE in the last 168 hours. No results for input(s): AMMONIA in the  last 168 hours.  CBC: Recent Labs  Lab 12/10/20 0458 12/11/20 0544  WBC 7.4 7.1  HGB 10.6* 10.0*  HCT 33.8* 30.8*  MCV 88.7 87.3  PLT 344 358    Cardiac Enzymes: No results for input(s): CKTOTAL, CKMB, CKMBINDEX, TROPONINI in the last 168 hours.  BNP (last 3 results) No results for input(s): BNP in the last 8760 hours.  ProBNP (last 3 results) No results for input(s): PROBNP in the last 8760 hours.  Radiological Exams: DG Abd Portable 1V  Result Date: 12/11/2020 CLINICAL DATA:  Abdominal distension. EXAM: PORTABLE ABDOMEN - 1 VIEW COMPARISON:  12/09/2020 FINDINGS: Gastrostomy tube is again noted within the upper abdomen. Gaseous distension of the colon is stable to improved in the interval. No new findings. IMPRESSION: Stable to improved gaseous distension of the colon compatible with colonic ileus. No new findings. Electronically Signed   By: Signa Kell M.D.   On: 12/11/2020 07:48    Assessment/Plan Active Problems:   Acute on chronic respiratory failure with hypoxia (HCC)  COVID-19 virus infection   Severe sepsis (HCC)   Acute renal failure due to tubular necrosis (HCC)   Healthcare-associated pneumonia   Acute respiratory distress syndrome (ARDS) due to COVID-19 virus (HCC)   1. Acute on chronic respiratory failure hypoxia we will continue with the wean on T collar trials patient is on 28% FiO2. 2. Code 18 virus infection recovery phase we will continue to follow 3. Severe sepsis treated resolved 4. Healthcare associated pneumonia improving chest x-ray showing stable changes. 5. ARDS secondary to COVID-19 continue to monitor. 6. Acute renal failure supportive care   I have personally seen and evaluated the patient, evaluated laboratory and imaging results, formulated the assessment and plan and placed orders. The Patient requires high complexity decision making with multiple systems involvement.  Rounds were done with the Respiratory Therapy Director and Staff  therapists and discussed with nursing staff also.  [name], MD [facility] Pulmonary Critical Care Medicine Sleep Medicine

## 2020-12-12 ENCOUNTER — Other Ambulatory Visit (HOSPITAL_COMMUNITY): Payer: Medicare Other

## 2020-12-12 DIAGNOSIS — N170 Acute kidney failure with tubular necrosis: Secondary | ICD-10-CM | POA: Diagnosis not present

## 2020-12-12 DIAGNOSIS — J189 Pneumonia, unspecified organism: Secondary | ICD-10-CM | POA: Diagnosis not present

## 2020-12-12 DIAGNOSIS — J9621 Acute and chronic respiratory failure with hypoxia: Secondary | ICD-10-CM | POA: Diagnosis not present

## 2020-12-12 DIAGNOSIS — U071 COVID-19: Secondary | ICD-10-CM | POA: Diagnosis not present

## 2020-12-12 NOTE — Progress Notes (Signed)
Pulmonary Critical Care Medicine [facility] [place]   PULMONARY CRITICAL CARE SERVICE  PROGRESS NOTE  Date of Service: 12/12/2020  Charles Genera.  HAL:[id number]  DOB: March 25, 1971   DOA: 12/02/2020  Referring Physician: Carron Curie, MD  HPI: Charles Roussel. is a 50 y.o. male seen for follow up of Acute on Chronic Respiratory Failure.  Currently is off the ventilator on T collar today will be going for 24 hours  Medications: Reviewed on Rounds  Physical Exam:  Vitals: Temperature 98.2 pulse 93 respiratory 29 blood pressure is 139/70 saturations 97%  Ventilator Settings on T collar FiO2 28%  . General: Comfortable at this time . Eyes: Grossly normal lids, irises & conjunctiva . ENT: grossly tongue is normal . Neck: no obvious mass . Cardiovascular: S1 S2 normal no gallop . Respiratory: No rhonchi very coarse breath sounds . Abdomen: soft . Skin: no rash seen on limited exam . Musculoskeletal: not rigid . Psychiatric:unable to assess . Neurologic: no seizure no involuntary movements         Lab Data:   Basic Metabolic Panel: Recent Labs  Lab 12/07/20 0352 12/08/20 0427 12/09/20 0855 12/10/20 0458 12/11/20 0544  NA  --  139  --  138 138  K 3.4* 2.7* 3.2* 3.4* 3.7  CL  --  104  --  104 104  CO2  --  25  --  24 22  GLUCOSE  --  141*  --  108* 145*  BUN  --  12  --  12 8  CREATININE  --  0.60*  --  0.52* 0.50*  CALCIUM  --  8.3*  --  8.7* 8.5*  MG  --  1.8  --   --   --     ABG: No results for input(s): PHART, PCO2ART, PO2ART, HCO3, O2SAT in the last 168 hours.  Liver Function Tests: Recent Labs  Lab 12/11/20 0544  AST 32  ALT 32  ALKPHOS 106  BILITOT 0.7  PROT 5.6*  ALBUMIN 2.2*   No results for input(s): LIPASE, AMYLASE in the last 168 hours. No results for input(s): AMMONIA in the last 168 hours.  CBC: Recent Labs  Lab 12/10/20 0458 12/11/20 0544  WBC 7.4 7.1  HGB 10.6* 10.0*  HCT 33.8* 30.8*  MCV 88.7 87.3  PLT 344 358     Cardiac Enzymes: No results for input(s): CKTOTAL, CKMB, CKMBINDEX, TROPONINI in the last 168 hours.  BNP (last 3 results) No results for input(s): BNP in the last 8760 hours.  ProBNP (last 3 results) No results for input(s): PROBNP in the last 8760 hours.  Radiological Exams: DG Abd 1 View  Result Date: 12/11/2020 CLINICAL DATA:  Ileus EXAM: ABDOMEN - 1 VIEW COMPARISON:  December 11, 2020 at 6:31 a.m. FINDINGS: The patient SPECT tube remains. Stable colonic prominence/dilatation. No other acute abnormalities. IMPRESSION: Stable colonic ileus. Electronically Signed   By: Gerome Sam III M.D   On: 12/11/2020 14:51   DG Abd Portable 1V  Result Date: 12/12/2020 CLINICAL DATA:  Abdominal distension/ileus. EXAM: PORTABLE ABDOMEN - 1 VIEW COMPARISON:  Yesterday FINDINGS: Diffuse gaseous distension colon with some stool seen along the flanks. Maximal colonic diameter is 8 cm at the distal transverse colon. No concerning mass effect or calcification. IMPRESSION: Continued colonic ileus pattern with similar degree of distension. Electronically Signed   By: Marnee Spring M.D.   On: 12/12/2020 07:57   DG Abd Portable 1V  Result Date: 12/11/2020 CLINICAL DATA:  Abdominal distension. EXAM: PORTABLE ABDOMEN - 1 VIEW COMPARISON:  12/09/2020 FINDINGS: Gastrostomy tube is again noted within the upper abdomen. Gaseous distension of the colon is stable to improved in the interval. No new findings. IMPRESSION: Stable to improved gaseous distension of the colon compatible with colonic ileus. No new findings. Electronically Signed   By: Signa Kell M.D.   On: 12/11/2020 07:48    Assessment/Plan Active Problems:   Acute on chronic respiratory failure with hypoxia (HCC)   COVID-19 virus infection   Severe sepsis (HCC)   Acute renal failure due to tubular necrosis (HCC)   Healthcare-associated pneumonia   Acute respiratory distress syndrome (ARDS) due to COVID-19 virus (HCC)   1. Acute on  chronic respiratory failure hypoxia we will continue with the T collar titrate oxygen continue pulmonary toilet 2. COVID-19 virus infection recovery 3. Severe sepsis treated resolving 4. Acute renal failure patient's renal function is normalized 5. Healthcare associated pneumonia treated 6. ARDS slow to improve   I have personally seen and evaluated the patient, evaluated laboratory and imaging results, formulated the assessment and plan and placed orders. The Patient requires high complexity decision making with multiple systems involvement.  Rounds were done with the Respiratory Therapy Director and Staff therapists and discussed with nursing staff also.  [name], MD [facility] Pulmonary Critical Care Medicine Sleep Medicine

## 2020-12-13 ENCOUNTER — Other Ambulatory Visit (HOSPITAL_COMMUNITY): Payer: Medicare Other

## 2020-12-13 DIAGNOSIS — J189 Pneumonia, unspecified organism: Secondary | ICD-10-CM | POA: Diagnosis not present

## 2020-12-13 DIAGNOSIS — U071 COVID-19: Secondary | ICD-10-CM | POA: Diagnosis not present

## 2020-12-13 DIAGNOSIS — N170 Acute kidney failure with tubular necrosis: Secondary | ICD-10-CM | POA: Diagnosis not present

## 2020-12-13 DIAGNOSIS — J9621 Acute and chronic respiratory failure with hypoxia: Secondary | ICD-10-CM | POA: Diagnosis not present

## 2020-12-13 NOTE — Progress Notes (Signed)
Pulmonary Critical Care Medicine [facility] [place]   PULMONARY CRITICAL CARE SERVICE  PROGRESS NOTE  Date of Service: 12/13/2020  Charles Levy.  CVE:[id number]  DOB: 04-06-1971   DOA: 12/02/2020  Referring Physician: Carron Curie, MD  HPI: Charles Levy. is a 50 y.o. male seen for follow up of Acute on Chronic Respiratory Failure.  Currently on T collar has been on 28% FiO2 has been on a #6 cuffless trach  Medications: Reviewed on Rounds  Physical Exam:  Vitals: Temperature is 97.6 pulse 106 respiratory 20 blood pressure is 132/74 saturations 97%  Ventilator Settings on T collar with an FiO2 28%  . General: Comfortable at this time . Eyes: Grossly normal lids, irises & conjunctiva . ENT: grossly tongue is normal . Neck: no obvious mass . Cardiovascular: S1 S2 normal no gallop . Respiratory: Scattered rhonchi expansion is equal at this time . Abdomen: soft . Skin: no rash seen on limited exam . Musculoskeletal: not rigid . Psychiatric:unable to assess . Neurologic: no seizure no involuntary movements         Lab Data:   Basic Metabolic Panel: Recent Labs  Lab 12/07/20 0352 12/08/20 0427 12/09/20 0855 12/10/20 0458 12/11/20 0544  NA  --  139  --  138 138  K 3.4* 2.7* 3.2* 3.4* 3.7  CL  --  104  --  104 104  CO2  --  25  --  24 22  GLUCOSE  --  141*  --  108* 145*  BUN  --  12  --  12 8  CREATININE  --  0.60*  --  0.52* 0.50*  CALCIUM  --  8.3*  --  8.7* 8.5*  MG  --  1.8  --   --   --     ABG: No results for input(s): PHART, PCO2ART, PO2ART, HCO3, O2SAT in the last 168 hours.  Liver Function Tests: Recent Labs  Lab 12/11/20 0544  AST 32  ALT 32  ALKPHOS 106  BILITOT 0.7  PROT 5.6*  ALBUMIN 2.2*   No results for input(s): LIPASE, AMYLASE in the last 168 hours. No results for input(s): AMMONIA in the last 168 hours.  CBC: Recent Labs  Lab 12/10/20 0458 12/11/20 0544  WBC 7.4 7.1  HGB 10.6* 10.0*  HCT 33.8* 30.8*  MCV  88.7 87.3  PLT 344 358    Cardiac Enzymes: No results for input(s): CKTOTAL, CKMB, CKMBINDEX, TROPONINI in the last 168 hours.  BNP (last 3 results) No results for input(s): BNP in the last 8760 hours.  ProBNP (last 3 results) No results for input(s): PROBNP in the last 8760 hours.  Radiological Exams: DG Abd 1 View  Result Date: 12/13/2020 CLINICAL DATA:  Ileus. EXAM: ABDOMEN - 1 VIEW COMPARISON:  12/12/2020 FINDINGS: A percutaneous gastrostomy tube is noted. Gaseous distension of the colon has not significantly changed with maximal diameter of approximately 8 cm. Stool in the ascending and descending colon has not significantly changed in overall amount. No acute osseous abnormality is seen. IMPRESSION: Unchanged gaseous distension of the colon compatible with ileus. Electronically Signed   By: Sebastian Ache M.D.   On: 12/13/2020 11:58   DG Abd Portable 1V  Result Date: 12/12/2020 CLINICAL DATA:  Abdominal distension/ileus. EXAM: PORTABLE ABDOMEN - 1 VIEW COMPARISON:  Yesterday FINDINGS: Diffuse gaseous distension colon with some stool seen along the flanks. Maximal colonic diameter is 8 cm at the distal transverse colon. No concerning mass effect or calcification. IMPRESSION:  Continued colonic ileus pattern with similar degree of distension. Electronically Signed   By: Marnee Spring M.D.   On: 12/12/2020 07:57    Assessment/Plan Active Problems:   Acute on chronic respiratory failure with hypoxia (HCC)   COVID-19 virus infection   Severe sepsis (HCC)   Acute renal failure due to tubular necrosis (HCC)   Healthcare-associated pneumonia   Acute respiratory distress syndrome (ARDS) due to COVID-19 virus (HCC)   1. Acute on chronic respiratory failure hypoxia we will continue T collar trials right now is a cuffless trach 2. COVID-19 virus infection recovery 3. Severe sepsis treated resolved 4. Acute tubular necrosis resolved 5. Healthcare associated pneumonia slowly  improving 6. ARDS slow improvement   I have personally seen and evaluated the patient, evaluated laboratory and imaging results, formulated the assessment and plan and placed orders. The Patient requires high complexity decision making with multiple systems involvement.  Rounds were done with the Respiratory Therapy Director and Staff therapists and discussed with nursing staff also.  [name], MD [facility] Pulmonary Critical Care Medicine Sleep Medicine

## 2020-12-14 DIAGNOSIS — N170 Acute kidney failure with tubular necrosis: Secondary | ICD-10-CM | POA: Diagnosis not present

## 2020-12-14 DIAGNOSIS — U071 COVID-19: Secondary | ICD-10-CM | POA: Diagnosis not present

## 2020-12-14 DIAGNOSIS — J9621 Acute and chronic respiratory failure with hypoxia: Secondary | ICD-10-CM | POA: Diagnosis not present

## 2020-12-14 DIAGNOSIS — J189 Pneumonia, unspecified organism: Secondary | ICD-10-CM | POA: Diagnosis not present

## 2020-12-14 NOTE — Progress Notes (Signed)
Pulmonary Critical Care Medicine [facility] [place]   PULMONARY CRITICAL CARE SERVICE  PROGRESS NOTE  Date of Service: 12/14/2020  Charles Levy.  WER:[id number]  DOB: 03-18-1971   DOA: 12/02/2020  Referring Physician: Carron Curie, MD  HPI: Charles Levy. is a 50 y.o. male seen for follow up of Acute on Chronic Respiratory Failure.  Patient is currently off the ventilator on T collar has been on 28% FiO2  Medications: Reviewed on Rounds  Physical Exam:  Vitals: Temperature 98.2 pulse 84 respiratory 24 blood pressure is 131/74 saturations 97%  Ventilator Settings on T collar with an FiO2 of 28%  . General: Comfortable at this time . Eyes: Grossly normal lids, irises & conjunctiva . ENT: grossly tongue is normal . Neck: no obvious mass . Cardiovascular: S1 S2 normal no gallop . Respiratory: No rhonchi very coarse breath sound . Abdomen: soft . Skin: no rash seen on limited exam . Musculoskeletal: not rigid . Psychiatric:unable to assess . Neurologic: no seizure no involuntary movements         Lab Data:   Basic Metabolic Panel: Recent Labs  Lab 12/08/20 0427 12/09/20 0855 12/10/20 0458 12/11/20 0544  NA 139  --  138 138  K 2.7* 3.2* 3.4* 3.7  CL 104  --  104 104  CO2 25  --  24 22  GLUCOSE 141*  --  108* 145*  BUN 12  --  12 8  CREATININE 0.60*  --  0.52* 0.50*  CALCIUM 8.3*  --  8.7* 8.5*  MG 1.8  --   --   --     ABG: No results for input(s): PHART, PCO2ART, PO2ART, HCO3, O2SAT in the last 168 hours.  Liver Function Tests: Recent Labs  Lab 12/11/20 0544  AST 32  ALT 32  ALKPHOS 106  BILITOT 0.7  PROT 5.6*  ALBUMIN 2.2*   No results for input(s): LIPASE, AMYLASE in the last 168 hours. No results for input(s): AMMONIA in the last 168 hours.  CBC: Recent Labs  Lab 12/10/20 0458 12/11/20 0544  WBC 7.4 7.1  HGB 10.6* 10.0*  HCT 33.8* 30.8*  MCV 88.7 87.3  PLT 344 358    Cardiac Enzymes: No results for input(s):  CKTOTAL, CKMB, CKMBINDEX, TROPONINI in the last 168 hours.  BNP (last 3 results) No results for input(s): BNP in the last 8760 hours.  ProBNP (last 3 results) No results for input(s): PROBNP in the last 8760 hours.  Radiological Exams: DG Abd 1 View  Result Date: 12/13/2020 CLINICAL DATA:  Ileus. EXAM: ABDOMEN - 1 VIEW COMPARISON:  12/12/2020 FINDINGS: A percutaneous gastrostomy tube is noted. Gaseous distension of the colon has not significantly changed with maximal diameter of approximately 8 cm. Stool in the ascending and descending colon has not significantly changed in overall amount. No acute osseous abnormality is seen. IMPRESSION: Unchanged gaseous distension of the colon compatible with ileus. Electronically Signed   By: Sebastian Ache M.D.   On: 12/13/2020 11:58    Assessment/Plan Active Problems:   Acute on chronic respiratory failure with hypoxia (HCC)   COVID-19 virus infection   Severe sepsis (HCC)   Acute renal failure due to tubular necrosis (HCC)   Healthcare-associated pneumonia   Acute respiratory distress syndrome (ARDS) due to COVID-19 virus (HCC)   1. Acute on chronic respiratory failure hypoxia we will continue with T-piece titrate oxygen continue pulmonary toilet. 2. COVID-19 virus infection recovery phase 3. Severe sepsis treated 4. Acute renal failure  supportive care 5. Healthcare associated pneumonia has been treated with antibiotics 6. ARDS very slow to improve secondary to COVID-19   I have personally seen and evaluated the patient, evaluated laboratory and imaging results, formulated the assessment and plan and placed orders. The Patient requires high complexity decision making with multiple systems involvement.  Rounds were done with the Respiratory Therapy Director and Staff therapists and discussed with nursing staff also.  [name], MD [facility] Pulmonary Critical Care Medicine Sleep Medicine

## 2020-12-15 ENCOUNTER — Other Ambulatory Visit (HOSPITAL_COMMUNITY): Payer: Medicare Other

## 2020-12-15 DIAGNOSIS — J9621 Acute and chronic respiratory failure with hypoxia: Secondary | ICD-10-CM | POA: Diagnosis not present

## 2020-12-15 DIAGNOSIS — N170 Acute kidney failure with tubular necrosis: Secondary | ICD-10-CM | POA: Diagnosis not present

## 2020-12-15 DIAGNOSIS — J189 Pneumonia, unspecified organism: Secondary | ICD-10-CM | POA: Diagnosis not present

## 2020-12-15 DIAGNOSIS — U071 COVID-19: Secondary | ICD-10-CM | POA: Diagnosis not present

## 2020-12-15 LAB — CBC
HCT: 36.1 % — ABNORMAL LOW (ref 39.0–52.0)
Hemoglobin: 11.7 g/dL — ABNORMAL LOW (ref 13.0–17.0)
MCH: 29 pg (ref 26.0–34.0)
MCHC: 32.4 g/dL (ref 30.0–36.0)
MCV: 89.4 fL (ref 80.0–100.0)
Platelets: 376 10*3/uL (ref 150–400)
RBC: 4.04 MIL/uL — ABNORMAL LOW (ref 4.22–5.81)
RDW: 20 % — ABNORMAL HIGH (ref 11.5–15.5)
WBC: 14.4 10*3/uL — ABNORMAL HIGH (ref 4.0–10.5)
nRBC: 0 % (ref 0.0–0.2)

## 2020-12-15 LAB — BASIC METABOLIC PANEL
Anion gap: 9 (ref 5–15)
BUN: 8 mg/dL (ref 6–20)
CO2: 24 mmol/L (ref 22–32)
Calcium: 8.5 mg/dL — ABNORMAL LOW (ref 8.9–10.3)
Chloride: 104 mmol/L (ref 98–111)
Creatinine, Ser: 0.53 mg/dL — ABNORMAL LOW (ref 0.61–1.24)
GFR, Estimated: >60 mL/min (ref >60)
Glucose, Bld: 162 mg/dL — ABNORMAL HIGH (ref 70–99)
Potassium: 4.4 mmol/L (ref 3.5–5.1)
Sodium: 137 mmol/L (ref 135–145)

## 2020-12-15 LAB — MAGNESIUM: Magnesium: 1.6 mg/dL — ABNORMAL LOW (ref 1.7–2.4)

## 2020-12-15 NOTE — Progress Notes (Signed)
Pulmonary Critical Care Medicine [facility] [place]   PULMONARY CRITICAL CARE SERVICE  PROGRESS NOTE  Date of Service: 12/15/2020  Charles Levy.  NFA:[id number]  DOB: 1971-06-03   DOA: 12/02/2020  Referring Physician: Carron Curie, MD  HPI: Charles Levy. is a 50 y.o. male seen for follow up of Acute on Chronic Respiratory Failure.  Patient is on T collar currently on 40% FiO2 and some issues with vomiting trach was changed out 2/2 with a cuffed trach  Medications: Reviewed on Rounds  Physical Exam:  Vitals: Temperature 98.1 pulse 102 respiratory 23 blood pressure is 138/91 saturations 96%  Ventilator Settings on T collar with an FiO2 of 40%  . General: Comfortable at this time . Eyes: Grossly normal lids, irises & conjunctiva . ENT: grossly tongue is normal . Neck: no obvious mass . Cardiovascular: S1 S2 normal no gallop . Respiratory: Scattered rhonchi expansion is equal at this time . Abdomen: soft . Skin: no rash seen on limited exam . Musculoskeletal: not rigid . Psychiatric:unable to assess . Neurologic: no seizure no involuntary movements         Lab Data:   Basic Metabolic Panel: Recent Labs  Lab 12/09/20 0855 12/10/20 0458 12/11/20 0544 12/15/20 0448  NA  --  138 138 137  K 3.2* 3.4* 3.7 4.4  CL  --  104 104 104  CO2  --  24 22 24   GLUCOSE  --  108* 145* 162*  BUN  --  12 8 8   CREATININE  --  0.52* 0.50* 0.53*  CALCIUM  --  8.7* 8.5* 8.5*  MG  --   --   --  1.6*    ABG: No results for input(s): PHART, PCO2ART, PO2ART, HCO3, O2SAT in the last 168 hours.  Liver Function Tests: Recent Labs  Lab 12/11/20 0544  AST 32  ALT 32  ALKPHOS 106  BILITOT 0.7  PROT 5.6*  ALBUMIN 2.2*   No results for input(s): LIPASE, AMYLASE in the last 168 hours. No results for input(s): AMMONIA in the last 168 hours.  CBC: Recent Labs  Lab 12/10/20 0458 12/11/20 0544 12/15/20 0448  WBC 7.4 7.1 14.4*  HGB 10.6* 10.0* 11.7*  HCT 33.8*  30.8* 36.1*  MCV 88.7 87.3 89.4  PLT 344 358 376    Cardiac Enzymes: No results for input(s): CKTOTAL, CKMB, CKMBINDEX, TROPONINI in the last 168 hours.  BNP (last 3 results) No results for input(s): BNP in the last 8760 hours.  ProBNP (last 3 results) No results for input(s): PROBNP in the last 8760 hours.  Radiological Exams: DG Abd 1 View  Result Date: 12/13/2020 CLINICAL DATA:  Ileus. EXAM: ABDOMEN - 1 VIEW COMPARISON:  12/12/2020 FINDINGS: A percutaneous gastrostomy tube is noted. Gaseous distension of the colon has not significantly changed with maximal diameter of approximately 8 cm. Stool in the ascending and descending colon has not significantly changed in overall amount. No acute osseous abnormality is seen. IMPRESSION: Unchanged gaseous distension of the colon compatible with ileus. Electronically Signed   By: 12/15/2020 M.D.   On: 12/13/2020 11:58   DG Chest Port 1 View  Result Date: 12/15/2020 CLINICAL DATA:  Aspiration. EXAM: PORTABLE CHEST 1 VIEW COMPARISON:  No prior. FINDINGS: Tracheostomy tube noted in good anatomic position. Right PICC line noted with its tip coursing up the right jugular vein. Repositioning should be considered. Cardiomegaly. Mild pulmonary venous congestion cannot be excluded. Diffuse bilateral interstitial prominence. Interstitial edema and/or pneumonitis could present  in this fashion. Low lung volumes with bibasilar atelectasis and infiltrates. Infiltrate noted in the right mid lung. Aspiration cannot be excluded. Small left pleural effusion cannot be excluded. No pneumothorax. IMPRESSION: 1. Tracheostomy tube noted in good anatomic position. Right PICC line noted with its tip coursing up the right jugular vein. Repositioning should be considered. 2. Cardiomegaly. Mild pulmonary venous congestion cannot be excluded. Diffuse bilateral interstitial prominence. Interstitial edema and or pneumonitis could present in this fashion. Small left pleural  effusion. 3. Low lung volumes with bibasilar atelectasis and infiltrates. Infiltrate noted in the right mid lung. Aspiration cannot be excluded. These results will be called to the ordering clinician or representative by the Radiologist Assistant, and communication documented in the PACS or [employer]. Electronically Signed   By: Maisie Fus  Register   On: 12/15/2020 06:26   DG Abd Portable 1V  Result Date: 12/15/2020 CLINICAL DATA:  Ileus. EXAM: PORTABLE ABDOMEN - 1 VIEW COMPARISON:  12/13/2020. FINDINGS: Gastrostomy tube noted over the stomach in stable position. Persistent colonic distention again noted. Sigmoid colon is distended up to 10 cm. Stool is noted throughout the colon. Air noted in the rectum. Again colonic ileus could present this fashion. Continued follow-up exam suggested to demonstrate resolution and to exclude bowel obstruction. No free air noted. Degenerative change lumbar spine and both hips. Pelvic calcifications consistent phleboliths. IMPRESSION: 1. Persistent colonic distention again noted. Sigmoid colon is distended up to 10 cm. Stool is noted throughout the colon. Air noted in the rectum. Again colonic ileus could present in this fashion. Continued follow-up exam suggested to demonstrate resolution and to exclude bowel obstruction. 2.  Gastrostomy tube in stable position. Electronically Signed   By: Maisie Fus  Register   On: 12/15/2020 06:11    Assessment/Plan Active Problems:   Acute on chronic respiratory failure with hypoxia (HCC)   COVID-19 virus infection   Severe sepsis (HCC)   Acute renal failure due to tubular necrosis (HCC)   Healthcare-associated pneumonia   Acute respiratory distress syndrome (ARDS) due to COVID-19 virus (HCC)   1. Acute on chronic respiratory failure hypoxia we will continue with T collar FiO2 40% titrate oxygen as tolerated continue secretion management supportive care. 2. COVID-19 virus infection recovery continue to follow 3. Severe sepsis  treated resolved 4. Acute renal failure patient's labs are being followed 5. Healthcare associated pneumonia treated slow improvement 6. ARDS patient is at baseline right now   I have personally seen and evaluated the patient, evaluated laboratory and imaging results, formulated the assessment and plan and placed orders. The Patient requires high complexity decision making with multiple systems involvement.  Rounds were done with the Respiratory Therapy Director and Staff therapists and discussed with nursing staff also.  [name], MD [facility] Pulmonary Critical Care Medicine Sleep Medicine

## 2020-12-16 ENCOUNTER — Other Ambulatory Visit (HOSPITAL_COMMUNITY): Payer: Medicare Other

## 2020-12-16 DIAGNOSIS — N170 Acute kidney failure with tubular necrosis: Secondary | ICD-10-CM | POA: Diagnosis not present

## 2020-12-16 DIAGNOSIS — J9621 Acute and chronic respiratory failure with hypoxia: Secondary | ICD-10-CM | POA: Diagnosis not present

## 2020-12-16 DIAGNOSIS — U071 COVID-19: Secondary | ICD-10-CM | POA: Diagnosis not present

## 2020-12-16 DIAGNOSIS — J189 Pneumonia, unspecified organism: Secondary | ICD-10-CM | POA: Diagnosis not present

## 2020-12-16 LAB — MAGNESIUM: Magnesium: 1.8 mg/dL (ref 1.7–2.4)

## 2020-12-16 NOTE — Progress Notes (Signed)
Pulmonary Critical Care Medicine [facility] [place]   PULMONARY CRITICAL CARE SERVICE  PROGRESS NOTE  Date of Service: 12/16/2020  Charles Levy.  EXN:[id number]  DOB: 08-23-1971   DOA: 12/02/2020  Referring Physician: Carron Curie, MD  HPI: Charles Levy. is a 50 y.o. male seen for follow up of Acute on Chronic Respiratory Failure.  Patient has been on T collar on 40% FiO2 holding the wean because of issues with GI which is being worked up  Medications: Reviewed on Rounds  Physical Exam:  Vitals: Temperature is 99.7 pulse 103 respiratory 29 blood pressure is 122/74 saturations 98%  Ventilator Settings on T collar currently off the ventilator.  . General: Comfortable at this time . Eyes: Grossly normal lids, irises & conjunctiva . ENT: grossly tongue is normal . Neck: no obvious mass . Cardiovascular: S1 S2 normal no gallop . Respiratory: Scattered rhonchi expansion is equal at this time . Abdomen: soft . Skin: no rash seen on limited exam . Musculoskeletal: not rigid . Psychiatric:unable to assess . Neurologic: no seizure no involuntary movements         Lab Data:   Basic Metabolic Panel: Recent Labs  Lab 12/09/20 0855 12/10/20 0458 12/11/20 0544 12/15/20 0448 12/16/20 0412  NA  --  138 138 137  --   K 3.2* 3.4* 3.7 4.4  --   CL  --  104 104 104  --   CO2  --  24 22 24   --   GLUCOSE  --  108* 145* 162*  --   BUN  --  12 8 8   --   CREATININE  --  0.52* 0.50* 0.53*  --   CALCIUM  --  8.7* 8.5* 8.5*  --   MG  --   --   --  1.6* 1.8    ABG: No results for input(s): PHART, PCO2ART, PO2ART, HCO3, O2SAT in the last 168 hours.  Liver Function Tests: Recent Labs  Lab 12/11/20 0544  AST 32  ALT 32  ALKPHOS 106  BILITOT 0.7  PROT 5.6*  ALBUMIN 2.2*   No results for input(s): LIPASE, AMYLASE in the last 168 hours. No results for input(s): AMMONIA in the last 168 hours.  CBC: Recent Labs  Lab 12/10/20 0458 12/11/20 0544  12/15/20 0448  WBC 7.4 7.1 14.4*  HGB 10.6* 10.0* 11.7*  HCT 33.8* 30.8* 36.1*  MCV 88.7 87.3 89.4  PLT 344 358 376    Cardiac Enzymes: No results for input(s): CKTOTAL, CKMB, CKMBINDEX, TROPONINI in the last 168 hours.  BNP (last 3 results) No results for input(s): BNP in the last 8760 hours.  ProBNP (last 3 results) No results for input(s): PROBNP in the last 8760 hours.  Radiological Exams: DG Chest Port 1 View  Result Date: 12/15/2020 CLINICAL DATA:  Aspiration. EXAM: PORTABLE CHEST 1 VIEW COMPARISON:  No prior. FINDINGS: Tracheostomy tube noted in good anatomic position. Right PICC line noted with its tip coursing up the right jugular vein. Repositioning should be considered. Cardiomegaly. Mild pulmonary venous congestion cannot be excluded. Diffuse bilateral interstitial prominence. Interstitial edema and/or pneumonitis could present in this fashion. Low lung volumes with bibasilar atelectasis and infiltrates. Infiltrate noted in the right mid lung. Aspiration cannot be excluded. Small left pleural effusion cannot be excluded. No pneumothorax. IMPRESSION: 1. Tracheostomy tube noted in good anatomic position. Right PICC line noted with its tip coursing up the right jugular vein. Repositioning should be considered. 2. Cardiomegaly. Mild pulmonary venous congestion  cannot be excluded. Diffuse bilateral interstitial prominence. Interstitial edema and or pneumonitis could present in this fashion. Small left pleural effusion. 3. Low lung volumes with bibasilar atelectasis and infiltrates. Infiltrate noted in the right mid lung. Aspiration cannot be excluded. These results will be called to the ordering clinician or representative by the Radiologist Assistant, and communication documented in the PACS or [employer]. Electronically Signed   By: Maisie Fus  Register   On: 12/15/2020 06:26   DG Abd Portable 1V  Result Date: 12/16/2020 CLINICAL DATA:  50 year old male with PEG tube.  Ileus.  EXAM: PORTABLE ABDOMEN - 1 VIEW COMPARISON:  12/15/2020 and earlier. FINDINGS: Portable AP supine view at 0527 hours. Stable peg tube in the left upper quadrant. Diffusely gas-filled large bowel loops throughout the abdomen and pelvis have not significantly changed over this series of exams since 12/09/2020. Gas-filled small bowel loops have decreased in size over this series of exams. No dilated small bowel currently. As before gas continues to the rectum. Stable other abdominal visceral contours and osseous structures. IMPRESSION: 1. Gas pattern compatible with large bowel ileus, not significantly changed since 12/09/2020. 2. Stable gastrostomy tube. Electronically Signed   By: Odessa Fleming M.D.   On: 12/16/2020 05:57   DG Abd Portable 1V  Result Date: 12/15/2020 CLINICAL DATA:  Ileus. EXAM: PORTABLE ABDOMEN - 1 VIEW COMPARISON:  12/13/2020. FINDINGS: Gastrostomy tube noted over the stomach in stable position. Persistent colonic distention again noted. Sigmoid colon is distended up to 10 cm. Stool is noted throughout the colon. Air noted in the rectum. Again colonic ileus could present this fashion. Continued follow-up exam suggested to demonstrate resolution and to exclude bowel obstruction. No free air noted. Degenerative change lumbar spine and both hips. Pelvic calcifications consistent phleboliths. IMPRESSION: 1. Persistent colonic distention again noted. Sigmoid colon is distended up to 10 cm. Stool is noted throughout the colon. Air noted in the rectum. Again colonic ileus could present in this fashion. Continued follow-up exam suggested to demonstrate resolution and to exclude bowel obstruction. 2.  Gastrostomy tube in stable position. Electronically Signed   By: Maisie Fus  Register   On: 12/15/2020 06:11    Assessment/Plan Active Problems:   Acute on chronic respiratory failure with hypoxia (HCC)   COVID-19 virus infection   Severe sepsis (HCC)   Acute renal failure due to tubular necrosis (HCC)    Healthcare-associated pneumonia   Acute respiratory distress syndrome (ARDS) due to COVID-19 virus (HCC)   1. Acute on chronic respiratory failure hypoxia we will continue with T-piece right now titrate oxygen continue pulmonary toilet. 2. COVID-19 virus infection recovery 3. Severe sepsis treated resolved 4. Acute renal failure no change we will continue to monitor 5. Healthcare associated pneumonia treated slow improvement 6. ARDS we will continue to monitor x-rays   I have personally seen and evaluated the patient, evaluated laboratory and imaging results, formulated the assessment and plan and placed orders. The Patient requires high complexity decision making with multiple systems involvement.  Rounds were done with the Respiratory Therapy Director and Staff therapists and discussed with nursing staff also.  [name], MD [facility] Pulmonary Critical Care Medicine Sleep Medicine

## 2020-12-17 ENCOUNTER — Other Ambulatory Visit (HOSPITAL_COMMUNITY): Payer: Medicare Other

## 2020-12-17 DIAGNOSIS — U071 COVID-19: Secondary | ICD-10-CM | POA: Diagnosis not present

## 2020-12-17 DIAGNOSIS — J189 Pneumonia, unspecified organism: Secondary | ICD-10-CM | POA: Diagnosis not present

## 2020-12-17 DIAGNOSIS — J9621 Acute and chronic respiratory failure with hypoxia: Secondary | ICD-10-CM | POA: Diagnosis not present

## 2020-12-17 DIAGNOSIS — N170 Acute kidney failure with tubular necrosis: Secondary | ICD-10-CM | POA: Diagnosis not present

## 2020-12-17 NOTE — Progress Notes (Signed)
Pulmonary Critical Care Medicine [facility] [place]   PULMONARY CRITICAL CARE SERVICE  PROGRESS NOTE  Date of Service: 12/17/2020  Charles Levy.  ALP:[id number]  DOB: 1971/09/23   DOA: 12/02/2020  Referring Physician: Carron Curie, MD  HPI: Charles Metayer. is a 50 y.o. male seen for follow up of Acute on Chronic Respiratory Failure.  Patient currently is on T collar has been on 35% FiO2 good saturations are noted  Medications: Reviewed on Rounds  Physical Exam:  Vitals: Temperature 98.1 pulse 99 respiratory 20 blood pressure is 111/69 saturations 96%  Ventilator Settings on T collar with an FiO2 of 35%  . General: Comfortable at this time . Eyes: Grossly normal lids, irises & conjunctiva . ENT: grossly tongue is normal . Neck: no obvious mass . Cardiovascular: S1 S2 normal no gallop . Respiratory: Scattered rhonchi very coarse breath sound . Abdomen: soft . Skin: no rash seen on limited exam . Musculoskeletal: not rigid . Psychiatric:unable to assess . Neurologic: no seizure no involuntary movements         Lab Data:   Basic Metabolic Panel: Recent Labs  Lab 12/11/20 0544 12/15/20 0448 12/16/20 0412  NA 138 137  --   K 3.7 4.4  --   CL 104 104  --   CO2 22 24  --   GLUCOSE 145* 162*  --   BUN 8 8  --   CREATININE 0.50* 0.53*  --   CALCIUM 8.5* 8.5*  --   MG  --  1.6* 1.8    ABG: No results for input(s): PHART, PCO2ART, PO2ART, HCO3, O2SAT in the last 168 hours.  Liver Function Tests: Recent Labs  Lab 12/11/20 0544  AST 32  ALT 32  ALKPHOS 106  BILITOT 0.7  PROT 5.6*  ALBUMIN 2.2*   No results for input(s): LIPASE, AMYLASE in the last 168 hours. No results for input(s): AMMONIA in the last 168 hours.  CBC: Recent Labs  Lab 12/11/20 0544 12/15/20 0448  WBC 7.1 14.4*  HGB 10.0* 11.7*  HCT 30.8* 36.1*  MCV 87.3 89.4  PLT 358 376    Cardiac Enzymes: No results for input(s): CKTOTAL, CKMB, CKMBINDEX, TROPONINI in the  last 168 hours.  BNP (last 3 results) No results for input(s): BNP in the last 8760 hours.  ProBNP (last 3 results) No results for input(s): PROBNP in the last 8760 hours.  Radiological Exams: DG Abd Portable 1V  Result Date: 12/16/2020 CLINICAL DATA:  50 year old male with PEG tube.  Ileus. EXAM: PORTABLE ABDOMEN - 1 VIEW COMPARISON:  12/15/2020 and earlier. FINDINGS: Portable AP supine view at 0527 hours. Stable peg tube in the left upper quadrant. Diffusely gas-filled large bowel loops throughout the abdomen and pelvis have not significantly changed over this series of exams since 12/09/2020. Gas-filled small bowel loops have decreased in size over this series of exams. No dilated small bowel currently. As before gas continues to the rectum. Stable other abdominal visceral contours and osseous structures. IMPRESSION: 1. Gas pattern compatible with large bowel ileus, not significantly changed since 12/09/2020. 2. Stable gastrostomy tube. Electronically Signed   By: Odessa Fleming M.D.   On: 12/16/2020 05:57    Assessment/Plan Active Problems:   Acute on chronic respiratory failure with hypoxia (HCC)   COVID-19 virus infection   Severe sepsis (HCC)   Acute renal failure due to tubular necrosis (HCC)   Healthcare-associated pneumonia   Acute respiratory distress syndrome (ARDS) due to COVID-19 virus (HCC)  1. Acute on chronic respiratory failure with hypoxia we will continue T collar FiO2 35% 2. COVID-19 virus infection recovery we will continue to follow 3. Severe sepsis treated resolved 4. Acute renal failure with tubular necrosis patient is at baseline 5. Healthcare associated pneumonia treated 6. ARDS at baseline   I have personally seen and evaluated the patient, evaluated laboratory and imaging results, formulated the assessment and plan and placed orders. The Patient requires high complexity decision making with multiple systems involvement.  Rounds were done with the Respiratory  Therapy Director and Staff therapists and discussed with nursing staff also.  [name], MD [facility] Pulmonary Critical Care Medicine Sleep Medicine

## 2020-12-18 ENCOUNTER — Other Ambulatory Visit (HOSPITAL_COMMUNITY): Payer: Medicare Other

## 2020-12-18 DIAGNOSIS — J9621 Acute and chronic respiratory failure with hypoxia: Secondary | ICD-10-CM | POA: Diagnosis not present

## 2020-12-18 DIAGNOSIS — U071 COVID-19: Secondary | ICD-10-CM | POA: Diagnosis not present

## 2020-12-18 DIAGNOSIS — J189 Pneumonia, unspecified organism: Secondary | ICD-10-CM | POA: Diagnosis not present

## 2020-12-18 DIAGNOSIS — N170 Acute kidney failure with tubular necrosis: Secondary | ICD-10-CM | POA: Diagnosis not present

## 2020-12-18 LAB — BLOOD GAS, ARTERIAL
Acid-base deficit: 1.6 mmol/L (ref 0.0–2.0)
Bicarbonate: 22.3 mmol/L (ref 20.0–28.0)
FIO2: 60
O2 Saturation: 92.7 %
Patient temperature: 37
pCO2 arterial: 35.6 mmHg (ref 32.0–48.0)
pH, Arterial: 7.414 (ref 7.350–7.450)
pO2, Arterial: 64.5 mmHg — ABNORMAL LOW (ref 83.0–108.0)

## 2020-12-18 LAB — BASIC METABOLIC PANEL
Anion gap: 10 (ref 5–15)
BUN: 6 mg/dL (ref 6–20)
CO2: 23 mmol/L (ref 22–32)
Calcium: 8.4 mg/dL — ABNORMAL LOW (ref 8.9–10.3)
Chloride: 101 mmol/L (ref 98–111)
Creatinine, Ser: 0.49 mg/dL — ABNORMAL LOW (ref 0.61–1.24)
GFR, Estimated: >60 mL/min (ref >60)
Glucose, Bld: 110 mg/dL — ABNORMAL HIGH (ref 70–99)
Potassium: 3.1 mmol/L — ABNORMAL LOW (ref 3.5–5.1)
Sodium: 134 mmol/L — ABNORMAL LOW (ref 135–145)

## 2020-12-18 LAB — PHOSPHORUS: Phosphorus: 2.6 mg/dL (ref 2.5–4.6)

## 2020-12-18 LAB — MAGNESIUM: Magnesium: 1.4 mg/dL — ABNORMAL LOW (ref 1.7–2.4)

## 2020-12-18 LAB — TRIGLYCERIDES: Triglycerides: 73 mg/dL (ref <150)

## 2020-12-18 NOTE — Progress Notes (Signed)
Pulmonary Critical Care Medicine [facility] [place]   PULMONARY CRITICAL CARE SERVICE  PROGRESS NOTE  Date of Service: 12/18/2020  Charles Levy.  QPY:[id number]  DOB: 1971/06/22   DOA: 12/02/2020  Referring Physician: Carron Curie, MD  HPI: Charles Levy. is a 50 y.o. male seen for follow up of Acute on Chronic Respiratory Failure.  Patient at this time is on T collar has been on 60% FiO2 noted to have some increase in secretions  Medications: Reviewed on Rounds  Physical Exam:  Vitals: Temperature is 98.6 pulse 100 respiratory 29 blood pressure is 156/84 saturations 95%  Ventilator Settings on T collar with an FiO2 of 60%  . General: Comfortable at this time . Eyes: Grossly normal lids, irises & conjunctiva . ENT: grossly tongue is normal . Neck: no obvious mass . Cardiovascular: S1 S2 normal no gallop . Respiratory: Scattered rhonchi expansion is equal at this time . Abdomen: soft . Skin: no rash seen on limited exam . Musculoskeletal: not rigid . Psychiatric:unable to assess . Neurologic: no seizure no involuntary movements         Lab Data:   Basic Metabolic Panel: Recent Labs  Lab 12/15/20 0448 12/16/20 0412 12/18/20 0440  NA 137  --  134*  K 4.4  --  3.1*  CL 104  --  101  CO2 24  --  23  GLUCOSE 162*  --  110*  BUN 8  --  6  CREATININE 0.53*  --  0.49*  CALCIUM 8.5*  --  8.4*  MG 1.6* 1.8 1.4*  PHOS  --   --  2.6    ABG: Recent Labs  Lab 12/18/20 0802  PHART 7.414  PCO2ART 35.6  PO2ART 64.5*  HCO3 22.3  O2SAT 92.7    Liver Function Tests: No results for input(s): AST, ALT, ALKPHOS, BILITOT, PROT, ALBUMIN in the last 168 hours. No results for input(s): LIPASE, AMYLASE in the last 168 hours. No results for input(s): AMMONIA in the last 168 hours.  CBC: Recent Labs  Lab 12/15/20 0448  WBC 14.4*  HGB 11.7*  HCT 36.1*  MCV 89.4  PLT 376    Cardiac Enzymes: No results for input(s): CKTOTAL, CKMB, CKMBINDEX,  TROPONINI in the last 168 hours.  BNP (last 3 results) No results for input(s): BNP in the last 8760 hours.  ProBNP (last 3 results) No results for input(s): PROBNP in the last 8760 hours.  Radiological Exams: DG Abd Portable 1V  Result Date: 12/18/2020 CLINICAL DATA:  50 year old male with suspected ileus. Follow-up study. EXAM: PORTABLE ABDOMEN - 1 VIEW COMPARISON:  Abdominal radiograph 12/16/2020. FINDINGS: Diffuse gaseous distension throughout the colon. Several loops of nondilated small bowel are also noted. Percutaneous enterogastric tube with retention balloon projecting over the stomach. No gross evidence of pneumoperitoneum. IMPRESSION: 1. The appearance is again most compatible with colonic ileus, similar to prior examinations. Electronically Signed   By: Trudie Reed M.D.   On: 12/18/2020 07:40    Assessment/Plan Active Problems:   Acute on chronic respiratory failure with hypoxia (HCC)   COVID-19 virus infection   Severe sepsis (HCC)   Acute renal failure due to tubular necrosis (HCC)   Healthcare-associated pneumonia   Acute respiratory distress syndrome (ARDS) due to COVID-19 virus (HCC)   1. Acute on chronic respiratory failure with hypoxia we will continue with T collar trials titrate oxygen and continue pulmonary toilet. 2. COVID-19 virus infection recovery 3. Severe sepsis treated resolving 4. Acute renal failure  tubular necrosis no change 5. Healthcare associated pneumonia treated 6. ARDS treated slowly improved   I have personally seen and evaluated the patient, evaluated laboratory and imaging results, formulated the assessment and plan and placed orders. The Patient requires high complexity decision making with multiple systems involvement.  Rounds were done with the Respiratory Therapy Director and Staff therapists and discussed with nursing staff also.  [name], MD [facility] Pulmonary Critical Care Medicine Sleep Medicine

## 2020-12-19 ENCOUNTER — Other Ambulatory Visit (HOSPITAL_COMMUNITY): Payer: Medicare Other

## 2020-12-19 DIAGNOSIS — J9621 Acute and chronic respiratory failure with hypoxia: Secondary | ICD-10-CM | POA: Diagnosis not present

## 2020-12-19 DIAGNOSIS — N170 Acute kidney failure with tubular necrosis: Secondary | ICD-10-CM | POA: Diagnosis not present

## 2020-12-19 DIAGNOSIS — J189 Pneumonia, unspecified organism: Secondary | ICD-10-CM | POA: Diagnosis not present

## 2020-12-19 DIAGNOSIS — U071 COVID-19: Secondary | ICD-10-CM | POA: Diagnosis not present

## 2020-12-19 LAB — BASIC METABOLIC PANEL
Anion gap: 11 (ref 5–15)
BUN: 10 mg/dL (ref 6–20)
CO2: 24 mmol/L (ref 22–32)
Calcium: 8.9 mg/dL (ref 8.9–10.3)
Chloride: 101 mmol/L (ref 98–111)
Creatinine, Ser: 0.56 mg/dL — ABNORMAL LOW (ref 0.61–1.24)
GFR, Estimated: >60 mL/min (ref >60)
Glucose, Bld: 178 mg/dL — ABNORMAL HIGH (ref 70–99)
Potassium: 3.8 mmol/L (ref 3.5–5.1)
Sodium: 136 mmol/L (ref 135–145)

## 2020-12-19 LAB — CBC
HCT: 36.9 % — ABNORMAL LOW (ref 39.0–52.0)
Hemoglobin: 11.5 g/dL — ABNORMAL LOW (ref 13.0–17.0)
MCH: 28 pg (ref 26.0–34.0)
MCHC: 31.2 g/dL (ref 30.0–36.0)
MCV: 89.8 fL (ref 80.0–100.0)
Platelets: 331 10*3/uL (ref 150–400)
RBC: 4.11 MIL/uL — ABNORMAL LOW (ref 4.22–5.81)
RDW: 18.1 % — ABNORMAL HIGH (ref 11.5–15.5)
WBC: 14.5 10*3/uL — ABNORMAL HIGH (ref 4.0–10.5)
nRBC: 0 % (ref 0.0–0.2)

## 2020-12-19 LAB — BLOOD GAS, ARTERIAL
Acid-Base Excess: 1.8 mmol/L (ref 0.0–2.0)
Acid-base deficit: 0.7 mmol/L (ref 0.0–2.0)
Bicarbonate: 25.6 mmol/L (ref 20.0–28.0)
Bicarbonate: 26.1 mmol/L (ref 20.0–28.0)
FIO2: 100
FIO2: 65
O2 Saturation: 97 %
O2 Saturation: 98.8 %
Patient temperature: 37.8
Patient temperature: 37
pCO2 arterial: 62.2 mmHg — ABNORMAL HIGH (ref 32.0–48.0)
pCO2 arterial: 42.7 mmHg (ref 32.0–48.0)
pH, Arterial: 7.244 — ABNORMAL LOW (ref 7.350–7.450)
pH, Arterial: 7.402 (ref 7.350–7.450)
pO2, Arterial: 111 mmHg — ABNORMAL HIGH (ref 83.0–108.0)
pO2, Arterial: 117 mmHg — ABNORMAL HIGH (ref 83.0–108.0)

## 2020-12-19 LAB — MAGNESIUM: Magnesium: 2 mg/dL (ref 1.7–2.4)

## 2020-12-19 LAB — BRAIN NATRIURETIC PEPTIDE: B Natriuretic Peptide: 83.8 pg/mL (ref 0.0–100.0)

## 2020-12-19 NOTE — Progress Notes (Signed)
Pulmonary Critical Care Medicine [facility] [place]   PULMONARY CRITICAL CARE SERVICE  PROGRESS NOTE  Date of Service: 12/19/2020  Charles Levy.  RCV:[id number]  DOB: 29-Aug-1971   DOA: 12/02/2020  Referring Physician: Carron Curie, MD  HPI: Charles Stocks. is a 50 y.o. male seen for follow up of Acute on Chronic Respiratory Failure. Patient was placed back on the ventilator overnight now is on assist control mode appears to be comfortable  Medications: Reviewed on Rounds  Physical Exam:  Vitals: Temperature is 99.6 pulse 100 respiratory 30 blood pressure was 156/84 saturations 95%  Ventilator Settings on assist control FiO2 65% tidal volume 450 PEEP of 5  . General: Comfortable at this time . Eyes: Grossly normal lids, irises & conjunctiva . ENT: grossly tongue is normal . Neck: no obvious mass . Cardiovascular: S1 S2 normal no gallop . Respiratory: No rhonchi very coarse breath sounds . Abdomen: soft . Skin: no rash seen on limited exam . Musculoskeletal: not rigid . Psychiatric:unable to assess . Neurologic: no seizure no involuntary movements         Lab Data:   Basic Metabolic Panel: Recent Labs  Lab 12/15/20 0448 12/16/20 0412 12/18/20 0440 12/19/20 0541  NA 137  --  134* 136  K 4.4  --  3.1* 3.8  CL 104  --  101 101  CO2 24  --  23 24  GLUCOSE 162*  --  110* 178*  BUN 8  --  6 10  CREATININE 0.53*  --  0.49* 0.56*  CALCIUM 8.5*  --  8.4* 8.9  MG 1.6* 1.8 1.4* 2.0  PHOS  --   --  2.6  --     ABG: Recent Labs  Lab 12/18/20 0802 12/19/20 0156 12/19/20 1015  PHART 7.414 7.244* 7.402  PCO2ART 35.6 62.2* 42.7  PO2ART 64.5* 111* 117*  HCO3 22.3 25.6 26.1  O2SAT 92.7 97.0 98.8    Liver Function Tests: No results for input(s): AST, ALT, ALKPHOS, BILITOT, PROT, ALBUMIN in the last 168 hours. No results for input(s): LIPASE, AMYLASE in the last 168 hours. No results for input(s): AMMONIA in the last 168 hours.  CBC: Recent Labs   Lab 12/15/20 0448 12/19/20 0541  WBC 14.4* 14.5*  HGB 11.7* 11.5*  HCT 36.1* 36.9*  MCV 89.4 89.8  PLT 376 331    Cardiac Enzymes: No results for input(s): CKTOTAL, CKMB, CKMBINDEX, TROPONINI in the last 168 hours.  BNP (last 3 results) Recent Labs    12/19/20 0541  BNP 83.8    ProBNP (last 3 results) No results for input(s): PROBNP in the last 8760 hours.  Radiological Exams: DG CHEST PORT 1 VIEW  Result Date: 12/19/2020 CLINICAL DATA:  Respiratory distress EXAM: PORTABLE CHEST 1 VIEW COMPARISON:  December 15, 2020 FINDINGS: The heart size and mediastinal contours are mildly enlarged. Tracheostomy tube is seen at the level of the clavicular heads. Fluffy patchy airspace opacities are seen throughout both lungs with interval progression since the prior exam. No pleural effusion is seen. No acute osseous abnormality. Partially visualized mildly dilated air-filled loops of colon are seen in the upper abdomen. IMPRESSION: Interval worsening in the multifocal fluffy airspace opacities throughout both lungs, likely consistent with multifocal pneumonia or pulmonary edema. Electronically Signed   By: Jonna Clark M.D.   On: 12/19/2020 01:01   DG Abd Portable 1V  Result Date: 12/19/2020 CLINICAL DATA:  Abdominal pain and bleeding EXAM: PORTABLE ABDOMEN - 1 VIEW COMPARISON:  None. FINDINGS: Again noted is air-filled dilated loops of bowel seen throughout the abdomen this is predominantly the colon measuring up to 7.6 cm as on prior exam. This is to the level of the rectum. Nondilated scattered loops of small bowel are noted. IMPRESSION: Unchanged dilated air-filled loops of colon as on the prior exam, likely due to colonic ileus Electronically Signed   By: Jonna Clark M.D.   On: 12/19/2020 00:58   DG Abd Portable 1V  Result Date: 12/18/2020 CLINICAL DATA:  50 year old male with suspected ileus. Follow-up study. EXAM: PORTABLE ABDOMEN - 1 VIEW COMPARISON:  Abdominal radiograph 12/16/2020.  FINDINGS: Diffuse gaseous distension throughout the colon. Several loops of nondilated small bowel are also noted. Percutaneous enterogastric tube with retention balloon projecting over the stomach. No gross evidence of pneumoperitoneum. IMPRESSION: 1. The appearance is again most compatible with colonic ileus, similar to prior examinations. Electronically Signed   By: Trudie Reed M.D.   On: 12/18/2020 07:40    Assessment/Plan Active Problems:   Acute on chronic respiratory failure with hypoxia (HCC)   COVID-19 virus infection   Severe sepsis (HCC)   Acute renal failure due to tubular necrosis (HCC)   Healthcare-associated pneumonia   Acute respiratory distress syndrome (ARDS) due to COVID-19 virus (HCC)   1. Acute on chronic respiratory failure hypoxia plan is going to be to continue with full support on the ventilator respiratory therapy will check the RSB I once again 2. COVID-19 virus infection recovery 3. Severe sepsis treated resolving 4. Acute renal failure supportive care 5. After associated pneumonia treated 6. ARDS slow improvement   I have personally seen and evaluated the patient, evaluated laboratory and imaging results, formulated the assessment and plan and placed orders. The Patient requires high complexity decision making with multiple systems involvement.  Rounds were done with the Respiratory Therapy Director and Staff therapists and discussed with nursing staff also.  [name], MD [facility] Pulmonary Critical Care Medicine Sleep Medicine

## 2020-12-20 ENCOUNTER — Other Ambulatory Visit (HOSPITAL_COMMUNITY): Payer: Medicare Other

## 2020-12-20 DIAGNOSIS — J189 Pneumonia, unspecified organism: Secondary | ICD-10-CM | POA: Diagnosis not present

## 2020-12-20 DIAGNOSIS — J9621 Acute and chronic respiratory failure with hypoxia: Secondary | ICD-10-CM | POA: Diagnosis not present

## 2020-12-20 DIAGNOSIS — N170 Acute kidney failure with tubular necrosis: Secondary | ICD-10-CM | POA: Diagnosis not present

## 2020-12-20 DIAGNOSIS — U071 COVID-19: Secondary | ICD-10-CM | POA: Diagnosis not present

## 2020-12-20 HISTORY — PX: IR GASTROSTOMY TUBE MOD SED: IMG625

## 2020-12-20 MED ORDER — LIDOCAINE VISCOUS HCL 2 % MT SOLN
OROMUCOSAL | Status: AC | PRN
  Administered 2020-12-20: 15 mL via OROMUCOSAL

## 2020-12-20 MED ORDER — IOHEXOL 300 MG/ML  SOLN
50.0000 mL | Freq: Once | INTRAMUSCULAR | Status: AC | PRN
  Administered 2020-12-20: 15 mL

## 2020-12-20 MED ORDER — LIDOCAINE VISCOUS HCL 2 % MT SOLN
OROMUCOSAL | Status: AC
  Filled 2020-12-20: qty 15

## 2020-12-20 NOTE — Progress Notes (Signed)
Pulmonary Critical Care Medicine [facility] [place]   PULMONARY CRITICAL CARE SERVICE  PROGRESS NOTE  Date of Service: 12/20/2020  Charles Levy.  [id number]  DOB: 08/04/1971   DOA: 12/02/2020  Referring Physician: Carron Curie, MD  HPI: Charles Levy. is a 50 y.o. male seen for follow up of Acute on Chronic Respiratory Failure.  Patient was placed back on the ventilator has had some issues with GI now is on assist control full support  Medications: Reviewed on Rounds  Physical Exam:  Vitals: Temperature 100.2 pulse 120 respiratory 27 blood pressure is 144/73 saturations 93%  Ventilator Settings on assist control FiO2 50% tidal volume 450 PEEP 5  . General: Comfortable at this time . Eyes: Grossly normal lids, irises & conjunctiva . ENT: grossly tongue is normal . Neck: no obvious mass . Cardiovascular: S1 S2 normal no gallop . Respiratory: No rhonchi scattered crackling . Abdomen: soft . Skin: no rash seen on limited exam . Musculoskeletal: not rigid . Psychiatric:unable to assess . Neurologic: no seizure no involuntary movements         Lab Data:   Basic Metabolic Panel: Recent Labs  Lab 12/15/20 0448 12/16/20 0412 12/18/20 0440 12/19/20 0541  NA 137  --  134* 136  K 4.4  --  3.1* 3.8  CL 104  --  101 101  CO2 24  --  23 24  GLUCOSE 162*  --  110* 178*  BUN 8  --  6 10  CREATININE 0.53*  --  0.49* 0.56*  CALCIUM 8.5*  --  8.4* 8.9  MG 1.6* 1.8 1.4* 2.0  PHOS  --   --  2.6  --     ABG: Recent Labs  Lab 12/18/20 0802 12/19/20 0156 12/19/20 1015  PHART 7.414 7.244* 7.402  PCO2ART 35.6 62.2* 42.7  PO2ART 64.5* 111* 117*  HCO3 22.3 25.6 26.1  O2SAT 92.7 97.0 98.8    Liver Function Tests: No results for input(s): AST, ALT, ALKPHOS, BILITOT, PROT, ALBUMIN in the last 168 hours. No results for input(s): LIPASE, AMYLASE in the last 168 hours. No results for input(s): AMMONIA in the last 168 hours.  CBC: Recent Labs  Lab  12/15/20 0448 12/19/20 0541  WBC 14.4* 14.5*  HGB 11.7* 11.5*  HCT 36.1* 36.9*  MCV 89.4 89.8  PLT 376 331    Cardiac Enzymes: No results for input(s): CKTOTAL, CKMB, CKMBINDEX, TROPONINI in the last 168 hours.  BNP (last 3 results) Recent Labs    12/19/20 0541  BNP 83.8    ProBNP (last 3 results) No results for input(s): PROBNP in the last 8760 hours.  Radiological Exams: CT ABDOMEN WO CONTRAST  Result Date: 12/19/2020 CLINICAL DATA:  Colonic ileus versus obstruction. Percutaneous gastrostomy tube placement. Acute respiratory distress syndrome due to COVID-19 virus. EXAM: CT ABDOMEN WITHOUT CONTRAST TECHNIQUE: Multidetector CT imaging of the abdomen was performed following the standard protocol without IV contrast. COMPARISON:  None. FINDINGS: Lower chest: Bibasilar pulmonary airspace disease is seen, suspicious for atypical infection such as viral pneumonia. Hepatobiliary: No masses visualized on this unenhanced exam. Gallbladder is unremarkable. No evidence of biliary ductal dilatation. Pancreas: No mass or inflammatory process visualized on this unenhanced exam. Spleen:  Within normal limits in size. Adrenals/Urinary tract: 4 mm calculus is seen in the interpolar region of the left kidney, however there is no evidence of hydronephrosis. Stomach/Bowel: Percutaneous gastrostomy tube is seen in appropriate position in the distal body of the stomach. Visualized small bowel  loops are nondilated, however there is marked dilatation of the visualized portion of the transverse colon. Vascular/Lymphatic: No pathologically enlarged lymph nodes identified. No evidence of abdominal aortic aneurysm. Aortic atherosclerotic calcification noted. Other:  None. Musculoskeletal:  No suspicious bone lesions identified. IMPRESSION: Percutaneous gastrostomy tube in appropriate position. Marked dilatation of visualized transverse colon, suspicious for colonic ileus although distal colonic obstruction cannot be  excluded on this exam. 4 mm nonobstructing left renal calculus. Bibasilar pulmonary airspace disease, consistent with atypical infection such as COVID 19 viral pneumonia. Electronically Signed   By: Danae Orleans M.D.   On: 12/19/2020 12:47   DG CHEST PORT 1 VIEW  Result Date: 12/19/2020 CLINICAL DATA:  Respiratory distress EXAM: PORTABLE CHEST 1 VIEW COMPARISON:  December 15, 2020 FINDINGS: The heart size and mediastinal contours are mildly enlarged. Tracheostomy tube is seen at the level of the clavicular heads. Fluffy patchy airspace opacities are seen throughout both lungs with interval progression since the prior exam. No pleural effusion is seen. No acute osseous abnormality. Partially visualized mildly dilated air-filled loops of colon are seen in the upper abdomen. IMPRESSION: Interval worsening in the multifocal fluffy airspace opacities throughout both lungs, likely consistent with multifocal pneumonia or pulmonary edema. Electronically Signed   By: Jonna Clark M.D.   On: 12/19/2020 01:01   DG Abd Portable 1V  Result Date: 12/20/2020 CLINICAL DATA:  Ileus. EXAM: PORTABLE ABDOMEN - 1 VIEW COMPARISON:  X-ray and CT of the abdomen on 12/19/2020 FINDINGS: Degree of ileus involving primarily the colon is similar to slightly improved compared to the prior x-ray and CT study. No gross signs of free intraperitoneal air. No abnormal calcifications. IMPRESSION: Degree of ileus involving primarily the colon is similar to slightly improved compared to the prior x-ray and CT study. Electronically Signed   By: Irish Lack M.D.   On: 12/20/2020 08:22   DG Abd Portable 1V  Result Date: 12/19/2020 CLINICAL DATA:  Abdominal pain and bleeding EXAM: PORTABLE ABDOMEN - 1 VIEW COMPARISON:  None. FINDINGS: Again noted is air-filled dilated loops of bowel seen throughout the abdomen this is predominantly the colon measuring up to 7.6 cm as on prior exam. This is to the level of the rectum. Nondilated scattered  loops of small bowel are noted. IMPRESSION: Unchanged dilated air-filled loops of colon as on the prior exam, likely due to colonic ileus Electronically Signed   By: Jonna Clark M.D.   On: 12/19/2020 00:58    Assessment/Plan Active Problems:   Acute on chronic respiratory failure with hypoxia (HCC)   COVID-19 virus infection   Severe sepsis (HCC)   Acute renal failure due to tubular necrosis (HCC)   Healthcare-associated pneumonia   Acute respiratory distress syndrome (ARDS) due to COVID-19 virus (HCC)   1. Acute on chronic respiratory failure hypoxia we will keep the patient on the ventilator for now.  Patient has significant issues as far as GI is concerned also has low-grade fevers noted.  This will be worked up 2. Severe sepsis now with low-grade fever will continue to monitor closely 3. COVID-19 virus infection recovery 4. Acute renal failure with tubular necrosis no change we will continue present management 5. Healthcare associated pneumonia treated 6. ARDS treated slowly improving   I have personally seen and evaluated the patient, evaluated laboratory and imaging results, formulated the assessment and plan and placed orders. The Patient requires high complexity decision making with multiple systems involvement.  Rounds were done with the Respiratory Therapy Director and  Staff therapists and discussed with nursing staff also.  Allyne Gee, MD [facility] Pulmonary Critical Care Medicine Sleep Medicine

## 2020-12-20 NOTE — Progress Notes (Addendum)
PEG dislodged Saturday night.  PEG originally placed on 11/24/20 at [facility] by Dr. Sheliah Hatch.  No tube size mentioned in OP note.   IR was requested for PEG tube replacement.  Will replace the tube in IR when schedule allows.   Consent obtained from Mrs. Ohanesian over phone, left in IR department.  Lynann Bologna Kaysin Brock PA-C 12/20/2020 10:24 AM

## 2020-12-21 ENCOUNTER — Other Ambulatory Visit (HOSPITAL_COMMUNITY): Payer: Medicare Other

## 2020-12-21 DIAGNOSIS — J189 Pneumonia, unspecified organism: Secondary | ICD-10-CM | POA: Diagnosis not present

## 2020-12-21 DIAGNOSIS — J9621 Acute and chronic respiratory failure with hypoxia: Secondary | ICD-10-CM | POA: Diagnosis not present

## 2020-12-21 DIAGNOSIS — N170 Acute kidney failure with tubular necrosis: Secondary | ICD-10-CM | POA: Diagnosis not present

## 2020-12-21 DIAGNOSIS — U071 COVID-19: Secondary | ICD-10-CM | POA: Diagnosis not present

## 2020-12-21 LAB — BASIC METABOLIC PANEL
Anion gap: 14 (ref 5–15)
BUN: 7 mg/dL (ref 6–20)
CO2: 25 mmol/L (ref 22–32)
Calcium: 8.7 mg/dL — ABNORMAL LOW (ref 8.9–10.3)
Chloride: 99 mmol/L (ref 98–111)
Creatinine, Ser: 0.47 mg/dL — ABNORMAL LOW (ref 0.61–1.24)
GFR, Estimated: >60 mL/min (ref >60)
Glucose, Bld: 160 mg/dL — ABNORMAL HIGH (ref 70–99)
Potassium: 3.7 mmol/L (ref 3.5–5.1)
Sodium: 138 mmol/L (ref 135–145)

## 2020-12-21 LAB — MAGNESIUM: Magnesium: 1.8 mg/dL (ref 1.7–2.4)

## 2020-12-21 LAB — PHOSPHORUS: Phosphorus: 2 mg/dL — ABNORMAL LOW (ref 2.5–4.6)

## 2020-12-21 NOTE — Progress Notes (Signed)
Pulmonary Critical Care Medicine [facility] [place]   PULMONARY CRITICAL CARE SERVICE  PROGRESS NOTE  Date of Service: 12/21/2020  Charles Levy.  KDT:[id number]  DOB: [birth year]   DOA: 12/02/2020  Referring Physician: Carron Curie, MD  HPI: Charles Levy. is a 50 y.o. male seen for follow up of Acute on Chronic Respiratory Failure.  Patient is comfortable right now have the PEG changed out right now is on pressure support goal of 16 hours  Medications: Reviewed on Rounds  Physical Exam:  Vitals: Temperature is 98.1 pulse 100 respiratory 24 blood pressure was 136/76 saturations 96%  Ventilator Settings on pressure support FiO2 30% pressure 12/7  . General: Comfortable at this time . Eyes: Grossly normal lids, irises & conjunctiva . ENT: grossly tongue is normal . Neck: no obvious mass . Cardiovascular: S1 S2 normal no gallop . Respiratory: Scattered rhonchi expansion is equal. . Abdomen: soft . Skin: no rash seen on limited exam . Musculoskeletal: not rigid . Psychiatric:unable to assess . Neurologic: no seizure no involuntary movements         Lab Data:   Basic Metabolic Panel: Recent Labs  Lab 12/15/20 0448 12/16/20 0412 12/18/20 0440 12/19/20 0541 12/21/20 0446  NA 137  --  134* 136 138  K 4.4  --  3.1* 3.8 3.7  CL 104  --  101 101 99  CO2 24  --  23 24 25   GLUCOSE 162*  --  110* 178* 160*  BUN 8  --  6 10 7   CREATININE 0.53*  --  0.49* 0.56* 0.47*  CALCIUM 8.5*  --  8.4* 8.9 8.7*  MG 1.6* 1.8 1.4* 2.0 1.8  PHOS  --   --  2.6  --  2.0*    ABG: Recent Labs  Lab 12/18/20 0802 12/19/20 0156 12/19/20 1015  PHART 7.414 7.244* 7.402  PCO2ART 35.6 62.2* 42.7  PO2ART 64.5* 111* 117*  HCO3 22.3 25.6 26.1  O2SAT 92.7 97.0 98.8    Liver Function Tests: No results for input(s): AST, ALT, ALKPHOS, BILITOT, PROT, ALBUMIN in the last 168 hours. No results for input(s): LIPASE, AMYLASE in the last 168 hours. No results for input(s):  AMMONIA in the last 168 hours.  CBC: Recent Labs  Lab 12/15/20 0448 12/19/20 0541  WBC 14.4* 14.5*  HGB 11.7* 11.5*  HCT 36.1* 36.9*  MCV 89.4 89.8  PLT 376 331    Cardiac Enzymes: No results for input(s): CKTOTAL, CKMB, CKMBINDEX, TROPONINI in the last 168 hours.  BNP (last 3 results) Recent Labs    12/19/20 0541  BNP 83.8    ProBNP (last 3 results) No results for input(s): PROBNP in the last 8760 hours.  Radiological Exams: CT ABDOMEN WO CONTRAST  Result Date: 12/19/2020 CLINICAL DATA:  Colonic ileus versus obstruction. Percutaneous gastrostomy tube placement. Acute respiratory distress syndrome due to COVID-19 virus. EXAM: CT ABDOMEN WITHOUT CONTRAST TECHNIQUE: Multidetector CT imaging of the abdomen was performed following the standard protocol without IV contrast. COMPARISON:  None. FINDINGS: Lower chest: Bibasilar pulmonary airspace disease is seen, suspicious for atypical infection such as viral pneumonia. Hepatobiliary: No masses visualized on this unenhanced exam. Gallbladder is unremarkable. No evidence of biliary ductal dilatation. Pancreas: No mass or inflammatory process visualized on this unenhanced exam. Spleen:  Within normal limits in size. Adrenals/Urinary tract: 4 mm calculus is seen in the interpolar region of the left kidney, however there is no evidence of hydronephrosis. Stomach/Bowel: Percutaneous gastrostomy tube is seen in appropriate  position in the distal body of the stomach. Visualized small bowel loops are nondilated, however there is marked dilatation of the visualized portion of the transverse colon. Vascular/Lymphatic: No pathologically enlarged lymph nodes identified. No evidence of abdominal aortic aneurysm. Aortic atherosclerotic calcification noted. Other:  None. Musculoskeletal:  No suspicious bone lesions identified. IMPRESSION: Percutaneous gastrostomy tube in appropriate position. Marked dilatation of visualized transverse colon, suspicious for  colonic ileus although distal colonic obstruction cannot be excluded on this exam. 4 mm nonobstructing left renal calculus. Bibasilar pulmonary airspace disease, consistent with atypical infection such as COVID 19 viral pneumonia. Electronically Signed   By: Danae Orleans M.D.   On: 12/19/2020 12:47   IR GASTROSTOMY TUBE MOD SED  Result Date: 12/20/2020 CLINICAL DATA:  Removal of gastrostomy catheter placed at outside institution earlier this month EXAM: PERC REPLACEMENT GASTROSTOMY FLUOROSCOPY TIME:  6 seconds; 4 mGy TECHNIQUE: The Foley catheter was removed from the tract. A new 20 French balloon retention gastrostomy catheter was easily advanced into the gastric lumen. Retention balloon was inflated with 10 mL sterile saline. Contrast injection confirms good position within the lumen of the decompressed stomach. COMPLICATIONS: COMPLICATIONS none IMPRESSION: 1. Technically successful 20 French balloon retention gastrostomy replacement under fluoroscopy. Okay for routine use. Electronically Signed   By: Corlis Leak M.D.   On: 12/20/2020 16:51   DG Abd Portable 1V  Result Date: 12/20/2020 CLINICAL DATA:  Ileus. EXAM: PORTABLE ABDOMEN - 1 VIEW COMPARISON:  X-ray and CT of the abdomen on 12/19/2020 FINDINGS: Degree of ileus involving primarily the colon is similar to slightly improved compared to the prior x-ray and CT study. No gross signs of free intraperitoneal air. No abnormal calcifications. IMPRESSION: Degree of ileus involving primarily the colon is similar to slightly improved compared to the prior x-ray and CT study. Electronically Signed   By: Irish Lack M.D.   On: 12/20/2020 08:22    Assessment/Plan Active Problems:   Acute on chronic respiratory failure with hypoxia (HCC)   COVID-19 virus infection   Severe sepsis (HCC)   Acute renal failure due to tubular necrosis (HCC)   Healthcare-associated pneumonia   Acute respiratory distress syndrome (ARDS) due to COVID-19 virus  (HCC)   1. Acute on chronic respiratory failure hypoxia patient is doing well with pressure support the goal is for 16 hours 2. COVID-19 virus infection recovery we will continue to monitor 3. Severe sepsis treated resolved 4. Acute renal failure with tubular necrosis 5. Healthcare associated pneumonia treated 6. ARDS patient is at baseline we will continue to monitor   I have personally seen and evaluated the patient, evaluated laboratory and imaging results, formulated the assessment and plan and placed orders. The Patient requires high complexity decision making with multiple systems involvement.  Rounds were done with the Respiratory Therapy Director and Staff therapists and discussed with nursing staff also.  [name], MD [facility] Pulmonary Critical Care Medicine Sleep Medicine

## 2020-12-22 ENCOUNTER — Other Ambulatory Visit (HOSPITAL_COMMUNITY): Payer: Medicare Other

## 2020-12-22 DIAGNOSIS — J189 Pneumonia, unspecified organism: Secondary | ICD-10-CM | POA: Diagnosis not present

## 2020-12-22 DIAGNOSIS — N170 Acute kidney failure with tubular necrosis: Secondary | ICD-10-CM | POA: Diagnosis not present

## 2020-12-22 DIAGNOSIS — J9621 Acute and chronic respiratory failure with hypoxia: Secondary | ICD-10-CM | POA: Diagnosis not present

## 2020-12-22 DIAGNOSIS — U071 COVID-19: Secondary | ICD-10-CM | POA: Diagnosis not present

## 2020-12-22 LAB — CULTURE, RESPIRATORY W GRAM STAIN

## 2020-12-22 NOTE — Progress Notes (Signed)
Pulmonary Critical Care Medicine [facility] [place]   PULMONARY CRITICAL CARE SERVICE  PROGRESS NOTE  Date of Service: 12/22/2020  Charles Levy.  PIR:[id number]  DOB: Feb 28, 1971   DOA: 12/02/2020  Referring Physician: Carron Curie, MD  HPI: Charles Levy. is a 50 y.o. male seen for follow up of Acute on Chronic Respiratory Failure.  Patient is afebrile right now comfortable without distress at this time patient is on pressure support  Medications: Reviewed on Rounds  Physical Exam:  Vitals: Temperature is 98.7 pulse 112 respiratory 27 blood pressure is 114/69 saturations 98%  Ventilator Settings on pressure support FiO2 is 45% pressure 12/7  . General: Comfortable at this time . Eyes: Grossly normal lids, irises & conjunctiva . ENT: grossly tongue is normal . Neck: no obvious mass . Cardiovascular: S1 S2 normal no gallop . Respiratory: Scattered rhonchi expansion is equal at this time . Abdomen: soft . Skin: no rash seen on limited exam . Musculoskeletal: not rigid . Psychiatric:unable to assess . Neurologic: no seizure no involuntary movements         Lab Data:   Basic Metabolic Panel: Recent Labs  Lab 12/16/20 0412 12/18/20 0440 12/19/20 0541 12/21/20 0446  NA  --  134* 136 138  K  --  3.1* 3.8 3.7  CL  --  101 101 99  CO2  --  23 24 25   GLUCOSE  --  110* 178* 160*  BUN  --  6 10 7   CREATININE  --  0.49* 0.56* 0.47*  CALCIUM  --  8.4* 8.9 8.7*  MG 1.8 1.4* 2.0 1.8  PHOS  --  2.6  --  2.0*    ABG: Recent Labs  Lab 12/18/20 0802 12/19/20 0156 12/19/20 1015  PHART 7.414 7.244* 7.402  PCO2ART 35.6 62.2* 42.7  PO2ART 64.5* 111* 117*  HCO3 22.3 25.6 26.1  O2SAT 92.7 97.0 98.8    Liver Function Tests: No results for input(s): AST, ALT, ALKPHOS, BILITOT, PROT, ALBUMIN in the last 168 hours. No results for input(s): LIPASE, AMYLASE in the last 168 hours. No results for input(s): AMMONIA in the last 168 hours.  CBC: Recent Labs   Lab 12/19/20 0541  WBC 14.5*  HGB 11.5*  HCT 36.9*  MCV 89.8  PLT 331    Cardiac Enzymes: No results for input(s): CKTOTAL, CKMB, CKMBINDEX, TROPONINI in the last 168 hours.  BNP (last 3 results) Recent Labs    12/19/20 0541  BNP 83.8    ProBNP (last 3 results) No results for input(s): PROBNP in the last 8760 hours.  Radiological Exams: IR GASTROSTOMY TUBE MOD SED  Result Date: 12/20/2020 CLINICAL DATA:  Removal of gastrostomy catheter placed at outside institution earlier this month EXAM: PERC REPLACEMENT GASTROSTOMY FLUOROSCOPY TIME:  6 seconds; 4 mGy TECHNIQUE: The Foley catheter was removed from the tract. A new 20 French balloon retention gastrostomy catheter was easily advanced into the gastric lumen. Retention balloon was inflated with 10 mL sterile saline. Contrast injection confirms good position within the lumen of the decompressed stomach. COMPLICATIONS: COMPLICATIONS none IMPRESSION: 1. Technically successful 20 French balloon retention gastrostomy replacement under fluoroscopy. Okay for routine use. Electronically Signed   By: 12/21/20 M.D.   On: 12/20/2020 16:51   DG CHEST PORT 1 VIEW  Result Date: 12/21/2020 CLINICAL DATA:  CHF.  Trach/vent. EXAM: PORTABLE CHEST 1 VIEW COMPARISON:  December 19, 2020 FINDINGS: Slightly improved but persistent diffuse patchy airspace opacities. No visible pneumothorax or pleural effusions  on this single semi-erect radiograph. Enlarged cardiac silhouette, similar to prior. Pulmonary vascular congestion. Tracheostomy tube tip projects midline at the level of clavicular heads. IMPRESSION: 1. Slightly improved bilateral diffuse patchy airspace opacities, which could represent multifocal pneumonia and/or edema. 2. Cardiomegaly and pulmonary vascular congestion Electronically Signed   By: Feliberto Harts MD   On: 12/21/2020 10:57   DG Abd Portable 1V  Result Date: 12/22/2020 CLINICAL DATA:  Ileus EXAM: PORTABLE ABDOMEN - 1 VIEW COMPARISON:   12/20/2020 FINDINGS: Gaseous distension of the colon has improved in the interval. A normal abdominal gas pattern is now present. No gross free intraperitoneal gas. No organomegaly. IMPRESSION: Normal abdominal gas pattern. Electronically Signed   By: Helyn Numbers MD   On: 12/22/2020 05:50    Assessment/Plan Active Problems:   Acute on chronic respiratory failure with hypoxia (HCC)   COVID-19 virus infection   Severe sepsis (HCC)   Acute renal failure due to tubular necrosis (HCC)   Healthcare-associated pneumonia   Acute respiratory distress syndrome (ARDS) due to COVID-19 virus (HCC)   1. Acute on chronic respiratory failure with hypoxia patient right now is supposed to wean on T collar for up to 4 hours 2. COVID-19 virus infection recovery 3. Severe sepsis treated resolved 4. Acute renal failure tubular necrosis 5. Healthcare associated pneumonia treated 6. ARDS improving we will continue to monitor.   I have personally seen and evaluated the patient, evaluated laboratory and imaging results, formulated the assessment and plan and placed orders. The Patient requires high complexity decision making with multiple systems involvement.  Rounds were done with the Respiratory Therapy Director and Staff therapists and discussed with nursing staff also.  [name], MD [facility] Pulmonary Critical Care Medicine Sleep Medicine

## 2020-12-23 DIAGNOSIS — J9621 Acute and chronic respiratory failure with hypoxia: Secondary | ICD-10-CM | POA: Diagnosis not present

## 2020-12-23 DIAGNOSIS — N170 Acute kidney failure with tubular necrosis: Secondary | ICD-10-CM | POA: Diagnosis not present

## 2020-12-23 DIAGNOSIS — J189 Pneumonia, unspecified organism: Secondary | ICD-10-CM | POA: Diagnosis not present

## 2020-12-23 DIAGNOSIS — U071 COVID-19: Secondary | ICD-10-CM | POA: Diagnosis not present

## 2020-12-23 LAB — CBC
HCT: 31.8 % — ABNORMAL LOW (ref 39.0–52.0)
Hemoglobin: 10.1 g/dL — ABNORMAL LOW (ref 13.0–17.0)
MCH: 27.4 pg (ref 26.0–34.0)
MCHC: 31.8 g/dL (ref 30.0–36.0)
MCV: 86.4 fL (ref 80.0–100.0)
Platelets: 313 10*3/uL (ref 150–400)
RBC: 3.68 MIL/uL — ABNORMAL LOW (ref 4.22–5.81)
RDW: 18.2 % — ABNORMAL HIGH (ref 11.5–15.5)
WBC: 5.8 10*3/uL (ref 4.0–10.5)
nRBC: 0 % (ref 0.0–0.2)

## 2020-12-23 LAB — PHOSPHORUS: Phosphorus: 3.4 mg/dL (ref 2.5–4.6)

## 2020-12-23 LAB — MAGNESIUM: Magnesium: 2 mg/dL (ref 1.7–2.4)

## 2020-12-23 NOTE — Progress Notes (Signed)
Pulmonary Critical Care Medicine [facility] [place]   PULMONARY CRITICAL CARE SERVICE  PROGRESS NOTE  Date of Service: 12/23/2020  Charles Levy.  WER:[id number]  DOB: Oct 13, 1971   DOA: 12/02/2020  Referring Physician: Carron Curie, MD  HPI: Charles Levy. is a 50 y.o. male seen for follow up of Acute on Chronic Respiratory Failure.  Patient is comfortable right now on T collar on 50% FiO2 with a goal of 12 hours  Medications: Reviewed on Rounds  Physical Exam:  Vitals: Temperature is 97.7 pulse 93 respiratory rate is 37 blood pressure is 112/74 saturations 96% on T collar with an FiO2 of 50%  Ventilator Settings currently on T collar FiO2 50%  . General: Comfortable at this time . Eyes: Grossly normal lids, irises & conjunctiva . ENT: grossly tongue is normal . Neck: no obvious mass . Cardiovascular: S1 S2 normal no gallop . Respiratory: No rhonchi no rales are noted at this time . Abdomen: soft . Skin: no rash seen on limited exam . Musculoskeletal: not rigid . Psychiatric:unable to assess . Neurologic: no seizure no involuntary movements         Lab Data:   Basic Metabolic Panel: Recent Labs  Lab 12/18/20 0440 12/19/20 0541 12/21/20 0446 12/23/20 0455  NA 134* 136 138  --   K 3.1* 3.8 3.7  --   CL 101 101 99  --   CO2 23 24 25   --   GLUCOSE 110* 178* 160*  --   BUN 6 10 7   --   CREATININE 0.49* 0.56* 0.47*  --   CALCIUM 8.4* 8.9 8.7*  --   MG 1.4* 2.0 1.8 2.0  PHOS 2.6  --  2.0* 3.4    ABG: Recent Labs  Lab 12/18/20 0802 12/19/20 0156 12/19/20 1015  PHART 7.414 7.244* 7.402  PCO2ART 35.6 62.2* 42.7  PO2ART 64.5* 111* 117*  HCO3 22.3 25.6 26.1  O2SAT 92.7 97.0 98.8    Liver Function Tests: No results for input(s): AST, ALT, ALKPHOS, BILITOT, PROT, ALBUMIN in the last 168 hours. No results for input(s): LIPASE, AMYLASE in the last 168 hours. No results for input(s): AMMONIA in the last 168 hours.  CBC: Recent Labs  Lab  12/19/20 0541 12/23/20 0455  WBC 14.5* 5.8  HGB 11.5* 10.1*  HCT 36.9* 31.8*  MCV 89.8 86.4  PLT 331 313    Cardiac Enzymes: No results for input(s): CKTOTAL, CKMB, CKMBINDEX, TROPONINI in the last 168 hours.  BNP (last 3 results) Recent Labs    12/19/20 0541  BNP 83.8    ProBNP (last 3 results) No results for input(s): PROBNP in the last 8760 hours.  Radiological Exams: DG CHEST PORT 1 VIEW  Result Date: 12/21/2020 CLINICAL DATA:  CHF.  Trach/vent. EXAM: PORTABLE CHEST 1 VIEW COMPARISON:  December 19, 2020 FINDINGS: Slightly improved but persistent diffuse patchy airspace opacities. No visible pneumothorax or pleural effusions on this single semi-erect radiograph. Enlarged cardiac silhouette, similar to prior. Pulmonary vascular congestion. Tracheostomy tube tip projects midline at the level of clavicular heads. IMPRESSION: 1. Slightly improved bilateral diffuse patchy airspace opacities, which could represent multifocal pneumonia and/or edema. 2. Cardiomegaly and pulmonary vascular congestion Electronically Signed   By: 02/20/2021 MD   On: 12/21/2020 10:57   DG Abd Portable 1V  Result Date: 12/22/2020 CLINICAL DATA:  Ileus EXAM: PORTABLE ABDOMEN - 1 VIEW COMPARISON:  12/20/2020 FINDINGS: Gaseous distension of the colon has improved in the interval. A normal abdominal gas  pattern is now present. No gross free intraperitoneal gas. No organomegaly. IMPRESSION: Normal abdominal gas pattern. Electronically Signed   By: Helyn Numbers MD   On: 12/22/2020 05:50    Assessment/Plan Active Problems:   Acute on chronic respiratory failure with hypoxia (HCC)   COVID-19 virus infection   Severe sepsis (HCC)   Acute renal failure due to tubular necrosis (HCC)   Healthcare-associated pneumonia   Acute respiratory distress syndrome (ARDS) due to COVID-19 virus (HCC)   1. Acute on chronic respiratory failure with hypoxia plan continue to wean on T collar as tolerated. 2. COVID-19  virus infection recovery phase plan is going to be to continue with full supportive care 3. Severe sepsis in resolution phase we will continue to follow along. 4. Acute renal failure tubular necrosis we will continue with supportive care 5. Healthcare associated pneumonia treated we will continue to monitor closely.   I have personally seen and evaluated the patient, evaluated laboratory and imaging results, formulated the assessment and plan and placed orders. The Patient requires high complexity decision making with multiple systems involvement.  Rounds were done with the Respiratory Therapy Director and Staff therapists and discussed with nursing staff also.  [name], MD [facility] Pulmonary Critical Care Medicine Sleep Medicine

## 2020-12-24 ENCOUNTER — Other Ambulatory Visit (HOSPITAL_COMMUNITY): Payer: Medicare Other

## 2020-12-24 DIAGNOSIS — N170 Acute kidney failure with tubular necrosis: Secondary | ICD-10-CM | POA: Diagnosis not present

## 2020-12-24 DIAGNOSIS — J189 Pneumonia, unspecified organism: Secondary | ICD-10-CM | POA: Diagnosis not present

## 2020-12-24 DIAGNOSIS — U071 COVID-19: Secondary | ICD-10-CM | POA: Diagnosis not present

## 2020-12-24 DIAGNOSIS — J9621 Acute and chronic respiratory failure with hypoxia: Secondary | ICD-10-CM | POA: Diagnosis not present

## 2020-12-24 LAB — CULTURE, BLOOD (ROUTINE X 2)
Culture: NO GROWTH
Culture: NO GROWTH
Special Requests: ADEQUATE

## 2020-12-24 LAB — PHOSPHORUS: Phosphorus: 3.9 mg/dL (ref 2.5–4.6)

## 2020-12-24 LAB — MAGNESIUM: Magnesium: 2 mg/dL (ref 1.7–2.4)

## 2020-12-24 NOTE — Progress Notes (Signed)
Pulmonary Critical Care Medicine [facility] [place]   PULMONARY CRITICAL CARE SERVICE  PROGRESS NOTE  Date of Service: 12/24/2020  Charles Levy.  ZES:[id number]  DOB: 08/26/1971   DOA: 12/02/2020  Referring Physician: Carron Curie, MD  HPI: Charles Levy. is a 50 y.o. male seen for follow up of Acute on Chronic Respiratory Failure.  Patient at this time is on assist control mode has been on 40% FiO2 with good saturations.  Medications: Reviewed on Rounds  Physical Exam:  Vitals: Temperature is 98.3 pulse 97 respiratory rate is 30 blood pressure is 130/86 saturations 97%  Ventilator Settings on assist control FiO2 is 40% tidal volume 450 with a PEEP of 5  . General: Comfortable at this time . Eyes: Grossly normal lids, irises & conjunctiva . ENT: grossly tongue is normal . Neck: no obvious mass . Cardiovascular: S1 S2 normal no gallop . Respiratory: Scattered rhonchi expansion is equal . Abdomen: soft . Skin: no rash seen on limited exam . Musculoskeletal: not rigid . Psychiatric:unable to assess . Neurologic: no seizure no involuntary movements         Lab Data:   Basic Metabolic Panel: Recent Labs  Lab 12/18/20 0440 12/19/20 0541 12/21/20 0446 12/23/20 0455 12/24/20 0511  NA 134* 136 138  --   --   K 3.1* 3.8 3.7  --   --   CL 101 101 99  --   --   CO2 23 24 25   --   --   GLUCOSE 110* 178* 160*  --   --   BUN 6 10 7   --   --   CREATININE 0.49* 0.56* 0.47*  --   --   CALCIUM 8.4* 8.9 8.7*  --   --   MG 1.4* 2.0 1.8 2.0 2.0  PHOS 2.6  --  2.0* 3.4 3.9    ABG: Recent Labs  Lab 12/18/20 0802 12/19/20 0156 12/19/20 1015  PHART 7.414 7.244* 7.402  PCO2ART 35.6 62.2* 42.7  PO2ART 64.5* 111* 117*  HCO3 22.3 25.6 26.1  O2SAT 92.7 97.0 98.8    Liver Function Tests: No results for input(s): AST, ALT, ALKPHOS, BILITOT, PROT, ALBUMIN in the last 168 hours. No results for input(s): LIPASE, AMYLASE in the last 168 hours. No results for  input(s): AMMONIA in the last 168 hours.  CBC: Recent Labs  Lab 12/19/20 0541 12/23/20 0455  WBC 14.5* 5.8  HGB 11.5* 10.1*  HCT 36.9* 31.8*  MCV 89.8 86.4  PLT 331 313    Cardiac Enzymes: No results for input(s): CKTOTAL, CKMB, CKMBINDEX, TROPONINI in the last 168 hours.  BNP (last 3 results) Recent Labs    12/19/20 0541  BNP 83.8    ProBNP (last 3 results) No results for input(s): PROBNP in the last 8760 hours.  Radiological Exams: DG Abd Portable 1V  Result Date: 12/24/2020 CLINICAL DATA:  Follow-up ileus EXAM: PORTABLE ABDOMEN - 1 VIEW COMPARISON:  12/23/2019 FINDINGS: Scattered large and small bowel gas is noted. No true obstructive changes are seen. No free air is noted. No abnormal mass or abnormal calcifications are seen. Bony structures are stable. IMPRESSION: Nonobstructive bowel gas pattern. Electronically Signed   By: 02/23/2021 M.D.   On: 12/24/2020 06:17    Assessment/Plan Active Problems:   Acute on chronic respiratory failure with hypoxia (HCC)   COVID-19 virus infection   Severe sepsis (HCC)   Acute renal failure due to tubular necrosis (HCC)   Healthcare-associated pneumonia  Acute respiratory distress syndrome (ARDS) due to COVID-19 virus (HCC)   1. Acute on chronic respiratory failure hypoxia plan is going to be to continue to try to wean patient supposed to do about 2 hours of T collar 2. COVID-19 virus infection recovery phase we will continue to follow 3. Acute renal failure with tubular necrosis no change continue present management 4. ARDS treated slow improvement   I have personally seen and evaluated the patient, evaluated laboratory and imaging results, formulated the assessment and plan and placed orders. The Patient requires high complexity decision making with multiple systems involvement.  Rounds were done with the Respiratory Therapy Director and Staff therapists and discussed with nursing staff also.  [name], MD  [facility] Pulmonary Critical Care Medicine Sleep Medicine

## 2020-12-25 DIAGNOSIS — U071 COVID-19: Secondary | ICD-10-CM | POA: Diagnosis not present

## 2020-12-25 DIAGNOSIS — N170 Acute kidney failure with tubular necrosis: Secondary | ICD-10-CM | POA: Diagnosis not present

## 2020-12-25 DIAGNOSIS — J189 Pneumonia, unspecified organism: Secondary | ICD-10-CM | POA: Diagnosis not present

## 2020-12-25 DIAGNOSIS — J9621 Acute and chronic respiratory failure with hypoxia: Secondary | ICD-10-CM | POA: Diagnosis not present

## 2020-12-25 LAB — TRIGLYCERIDES: Triglycerides: 101 mg/dL (ref <150)

## 2020-12-25 NOTE — Progress Notes (Signed)
Pulmonary Critical Care Medicine [facility] [place]   PULMONARY CRITICAL CARE SERVICE  PROGRESS NOTE  Date of Service: 12/25/2020  Charles Levy.  FTD:[id number]  DOB: [birth year]   DOA: 12/02/2020  Referring Physician: Carron Curie, MD  HPI: Charles Levy. is a 50 y.o. male seen for follow up of Acute on Chronic Respiratory Failure.  Patient is on pressure support was attempted on T collar but failed we will going to reassess again today  Medications: Reviewed on Rounds  Physical Exam:  Vitals: Temperature 98.5 pulse 93 respiratory 20 blood pressure is 121/73 saturations 100%  Ventilator Settings on pressure support FiO2 35% pressure 12/5  . General: Comfortable at this time . Eyes: Grossly normal lids, irises & conjunctiva . ENT: grossly tongue is normal . Neck: no obvious mass . Cardiovascular: S1 S2 normal no gallop . Respiratory: No rhonchi very coarse breath . Abdomen: soft . Skin: no rash seen on limited exam . Musculoskeletal: not rigid . Psychiatric:unable to assess . Neurologic: no seizure no involuntary movements         Lab Data:   Basic Metabolic Panel: Recent Labs  Lab 12/19/20 0541 12/21/20 0446 12/23/20 0455 12/24/20 0511  NA 136 138  --   --   K 3.8 3.7  --   --   CL 101 99  --   --   CO2 24 25  --   --   GLUCOSE 178* 160*  --   --   BUN 10 7  --   --   CREATININE 0.56* 0.47*  --   --   CALCIUM 8.9 8.7*  --   --   MG 2.0 1.8 2.0 2.0  PHOS  --  2.0* 3.4 3.9    ABG: Recent Labs  Lab 12/19/20 0156 12/19/20 1015  PHART 7.244* 7.402  PCO2ART 62.2* 42.7  PO2ART 111* 117*  HCO3 25.6 26.1  O2SAT 97.0 98.8    Liver Function Tests: No results for input(s): AST, ALT, ALKPHOS, BILITOT, PROT, ALBUMIN in the last 168 hours. No results for input(s): LIPASE, AMYLASE in the last 168 hours. No results for input(s): AMMONIA in the last 168 hours.  CBC: Recent Labs  Lab 12/19/20 0541 12/23/20 0455  WBC 14.5* 5.8  HGB 11.5*  10.1*  HCT 36.9* 31.8*  MCV 89.8 86.4  PLT 331 313    Cardiac Enzymes: No results for input(s): CKTOTAL, CKMB, CKMBINDEX, TROPONINI in the last 168 hours.  BNP (last 3 results) Recent Labs    12/19/20 0541  BNP 83.8    ProBNP (last 3 results) No results for input(s): PROBNP in the last 8760 hours.  Radiological Exams: DG Abd Portable 1V  Result Date: 12/24/2020 CLINICAL DATA:  Follow-up ileus EXAM: PORTABLE ABDOMEN - 1 VIEW COMPARISON:  12/23/2019 FINDINGS: Scattered large and small bowel gas is noted. No true obstructive changes are seen. No free air is noted. No abnormal mass or abnormal calcifications are seen. Bony structures are stable. IMPRESSION: Nonobstructive bowel gas pattern. Electronically Signed   By: Alcide Clever M.D.   On: 12/24/2020 06:17    Assessment/Plan Active Problems:   Acute on chronic respiratory failure with hypoxia (HCC)   COVID-19 virus infection   Severe sepsis (HCC)   Acute renal failure due to tubular necrosis (HCC)   Healthcare-associated pneumonia   Acute respiratory distress syndrome (ARDS) due to COVID-19 virus (HCC)   1. Acute on chronic respiratory failure with hypoxia we will once again try to wean  patient on pressure support and T collar. 2. COVID-19 virus infection recovery we will continue to follow 3. Severe sepsis treated 4. Acute renal failure with tubular necrosis no change we will continue to follow. 5. Healthcare associated pneumonia treated 6. ARDS slow improvement   I have personally seen and evaluated the patient, evaluated laboratory and imaging results, formulated the assessment and plan and placed orders. The Patient requires high complexity decision making with multiple systems involvement.  Rounds were done with the Respiratory Therapy Director and Staff therapists and discussed with nursing staff also.  [name], MD [facility] Pulmonary Critical Care Medicine Sleep Medicine

## 2020-12-26 LAB — BASIC METABOLIC PANEL
Anion gap: 9 (ref 5–15)
BUN: 13 mg/dL (ref 6–20)
CO2: 36 mmol/L — ABNORMAL HIGH (ref 22–32)
Calcium: 9.3 mg/dL (ref 8.9–10.3)
Chloride: 97 mmol/L — ABNORMAL LOW (ref 98–111)
Creatinine, Ser: 0.67 mg/dL (ref 0.61–1.24)
GFR, Estimated: >60 mL/min (ref >60)
Glucose, Bld: 156 mg/dL — ABNORMAL HIGH (ref 70–99)
Potassium: 4 mmol/L (ref 3.5–5.1)
Sodium: 142 mmol/L (ref 135–145)

## 2020-12-26 LAB — CBC
HCT: 36.5 % — ABNORMAL LOW (ref 39.0–52.0)
Hemoglobin: 11.1 g/dL — ABNORMAL LOW (ref 13.0–17.0)
MCH: 27.1 pg (ref 26.0–34.0)
MCHC: 30.4 g/dL (ref 30.0–36.0)
MCV: 89 fL (ref 80.0–100.0)
Platelets: 431 10*3/uL — ABNORMAL HIGH (ref 150–400)
RBC: 4.1 MIL/uL — ABNORMAL LOW (ref 4.22–5.81)
RDW: 18.2 % — ABNORMAL HIGH (ref 11.5–15.5)
WBC: 8.7 10*3/uL (ref 4.0–10.5)
nRBC: 0 % (ref 0.0–0.2)

## 2020-12-26 LAB — MAGNESIUM: Magnesium: 2 mg/dL (ref 1.7–2.4)

## 2020-12-27 LAB — PHOSPHORUS: Phosphorus: 3.7 mg/dL (ref 2.5–4.6)

## 2020-12-27 LAB — MAGNESIUM: Magnesium: 2 mg/dL (ref 1.7–2.4)

## 2020-12-28 DIAGNOSIS — N170 Acute kidney failure with tubular necrosis: Secondary | ICD-10-CM | POA: Diagnosis not present

## 2020-12-28 DIAGNOSIS — J189 Pneumonia, unspecified organism: Secondary | ICD-10-CM | POA: Diagnosis not present

## 2020-12-28 DIAGNOSIS — U071 COVID-19: Secondary | ICD-10-CM | POA: Diagnosis not present

## 2020-12-28 DIAGNOSIS — J9621 Acute and chronic respiratory failure with hypoxia: Secondary | ICD-10-CM | POA: Diagnosis not present

## 2020-12-28 NOTE — Progress Notes (Signed)
Pulmonary Critical Care Medicine [facility] [place]   PULMONARY CRITICAL CARE SERVICE  PROGRESS NOTE  Date of Service: 12/28/2020  Charles Levy.  GYI:[id number]  DOB: 27-Aug-1971   DOA: 12/02/2020  Referring Physician: Carron Curie, MD  HPI: Charles Levy. is a 50 y.o. male seen for follow up of Acute on Chronic Respiratory Failure.  Patient currently is on T collar wean did 10 hours yesterday today for 12 hours  Medications: Reviewed on Rounds  Physical Exam:  Vitals: Temperature is 97.3 pulse 92 respiratory rate 30 blood pressure is 117/76 saturations 96%  Ventilator Settings on T collar FiO2 35%  . General: Comfortable at this time . Eyes: Grossly normal lids, irises & conjunctiva . ENT: grossly tongue is normal . Neck: no obvious mass . Cardiovascular: S1 S2 normal no gallop . Respiratory: No rhonchi no rales are noted at this time . Abdomen: soft . Skin: no rash seen on limited exam . Musculoskeletal: not rigid . Psychiatric:unable to assess . Neurologic: no seizure no involuntary movements         Lab Data:   Basic Metabolic Panel: Recent Labs  Lab 12/23/20 0455 12/24/20 0511 12/26/20 0340 12/27/20 0534  NA  --   --  142  --   K  --   --  4.0  --   CL  --   --  97*  --   CO2  --   --  36*  --   GLUCOSE  --   --  156*  --   BUN  --   --  13  --   CREATININE  --   --  0.67  --   CALCIUM  --   --  9.3  --   MG 2.0 2.0 2.0 2.0  PHOS 3.4 3.9  --  3.7    ABG: No results for input(s): PHART, PCO2ART, PO2ART, HCO3, O2SAT in the last 168 hours.  Liver Function Tests: No results for input(s): AST, ALT, ALKPHOS, BILITOT, PROT, ALBUMIN in the last 168 hours. No results for input(s): LIPASE, AMYLASE in the last 168 hours. No results for input(s): AMMONIA in the last 168 hours.  CBC: Recent Labs  Lab 12/23/20 0455 12/26/20 0340  WBC 5.8 8.7  HGB 10.1* 11.1*  HCT 31.8* 36.5*  MCV 86.4 89.0  PLT 313 431*    Cardiac Enzymes: No  results for input(s): CKTOTAL, CKMB, CKMBINDEX, TROPONINI in the last 168 hours.  BNP (last 3 results) Recent Labs    12/19/20 0541  BNP 83.8    ProBNP (last 3 results) No results for input(s): PROBNP in the last 8760 hours.  Radiological Exams: No results found.  Assessment/Plan Active Problems:   Acute on chronic respiratory failure with hypoxia (HCC)   COVID-19 virus infection   Severe sepsis (HCC)   Acute renal failure due to tubular necrosis (HCC)   Healthcare-associated pneumonia   Acute respiratory distress syndrome (ARDS) due to COVID-19 virus (HCC)   1. Acute on chronic respiratory failure hypoxia plan is to continue with the T collar wean as tolerated continue secretion management supportive care 2. COVID-19 virus infection recovery 3. Severe sepsis treated resolving 4. Acute renal failure no change 5. Healthcare associated pneumonia improving 6. ARDS treated   I have personally seen and evaluated the patient, evaluated laboratory and imaging results, formulated the assessment and plan and placed orders. The Patient requires high complexity decision making with multiple systems involvement.  Rounds were done with the  Respiratory Therapy Director and Staff therapists and discussed with nursing staff also.  Allyne Gee, MD [facility] Pulmonary Critical Care Medicine Sleep Medicine

## 2020-12-29 DIAGNOSIS — J9621 Acute and chronic respiratory failure with hypoxia: Secondary | ICD-10-CM | POA: Diagnosis not present

## 2020-12-29 DIAGNOSIS — J189 Pneumonia, unspecified organism: Secondary | ICD-10-CM | POA: Diagnosis not present

## 2020-12-29 DIAGNOSIS — U071 COVID-19: Secondary | ICD-10-CM | POA: Diagnosis not present

## 2020-12-29 DIAGNOSIS — N170 Acute kidney failure with tubular necrosis: Secondary | ICD-10-CM | POA: Diagnosis not present

## 2020-12-29 NOTE — Progress Notes (Signed)
Pulmonary Critical Care Medicine [facility] [place]   PULMONARY CRITICAL CARE SERVICE  PROGRESS NOTE  Date of Service: 12/29/2020  Charles Levy.  YSA:[id number]  DOB: [birth year]   DOA: 12/02/2020  Referring Physician: Carron Curie, MD  HPI: Charles Levy. is a 50 y.o. male seen for follow up of Acute on Chronic Respiratory Failure.  Patient is on T collar goal of 16 hours today  Medications: Reviewed on Rounds  Physical Exam:  Vitals: Temperature is 98.0 pulse 84 respiratory blood pressure is 134/85 saturations 97%  Ventilator Settings of the ventilator on T collar  . General: Comfortable at this time . Eyes: Grossly normal lids, irises & conjunctiva . ENT: grossly tongue is normal . Neck: no obvious mass . Cardiovascular: S1 S2 normal no gallop . Respiratory: Scattered rhonchi expansion is equal . Abdomen: soft . Skin: no rash seen on limited exam . Musculoskeletal: not rigid . Psychiatric:unable to assess . Neurologic: no seizure no involuntary movements         Lab Data:   Basic Metabolic Panel: Recent Labs  Lab 12/23/20 0455 12/24/20 0511 12/26/20 0340 12/27/20 0534  NA  --   --  142  --   K  --   --  4.0  --   CL  --   --  97*  --   CO2  --   --  36*  --   GLUCOSE  --   --  156*  --   BUN  --   --  13  --   CREATININE  --   --  0.67  --   CALCIUM  --   --  9.3  --   MG 2.0 2.0 2.0 2.0  PHOS 3.4 3.9  --  3.7    ABG: No results for input(s): PHART, PCO2ART, PO2ART, HCO3, O2SAT in the last 168 hours.  Liver Function Tests: No results for input(s): AST, ALT, ALKPHOS, BILITOT, PROT, ALBUMIN in the last 168 hours. No results for input(s): LIPASE, AMYLASE in the last 168 hours. No results for input(s): AMMONIA in the last 168 hours.  CBC: Recent Labs  Lab 12/23/20 0455 12/26/20 0340  WBC 5.8 8.7  HGB 10.1* 11.1*  HCT 31.8* 36.5*  MCV 86.4 89.0  PLT 313 431*    Cardiac Enzymes: No results for input(s): CKTOTAL, CKMB,  CKMBINDEX, TROPONINI in the last 168 hours.  BNP (last 3 results) Recent Labs    12/19/20 0541  BNP 83.8    ProBNP (last 3 results) No results for input(s): PROBNP in the last 8760 hours.  Radiological Exams: No results found.  Assessment/Plan Active Problems:   Acute on chronic respiratory failure with hypoxia (HCC)   COVID-19 virus infection   Severe sepsis (HCC)   Acute renal failure due to tubular necrosis (HCC)   Healthcare-associated pneumonia   Acute respiratory distress syndrome (ARDS) due to COVID-19 virus (HCC)   1. Acute on chronic respiratory failure hypoxia we will continue to collar trials titrate oxygen continue pulmonary toilet. 2. COVID-19 virus infection recovery 3. Severe sepsis treated resolved 4. Acute renal failure no change 5. Healthcare associated pneumonia treated 6. ARDS treatment continue to follow-up   I have personally seen and evaluated the patient, evaluated laboratory and imaging results, formulated the assessment and plan and placed orders. The Patient requires high complexity decision making with multiple systems involvement.  Rounds were done with the Respiratory Therapy Director and Staff therapists and discussed with nursing staff also.  Allyne Gee, MD [facility] Pulmonary Critical Care Medicine Sleep Medicine

## 2020-12-30 DIAGNOSIS — N170 Acute kidney failure with tubular necrosis: Secondary | ICD-10-CM | POA: Diagnosis not present

## 2020-12-30 DIAGNOSIS — U071 COVID-19: Secondary | ICD-10-CM | POA: Diagnosis not present

## 2020-12-30 DIAGNOSIS — J9621 Acute and chronic respiratory failure with hypoxia: Secondary | ICD-10-CM | POA: Diagnosis not present

## 2020-12-30 DIAGNOSIS — J189 Pneumonia, unspecified organism: Secondary | ICD-10-CM | POA: Diagnosis not present

## 2020-12-30 NOTE — Progress Notes (Signed)
Pulmonary Critical Care Medicine [facility] [place]   PULMONARY CRITICAL CARE SERVICE  PROGRESS NOTE  Date of Service: 12/30/2020  Charles Levy.  [id number]  DOB: August 09, 1971   DOA: 12/02/2020  Referring Physician: Carron Curie, MD  HPI: Charles Levy. is a 50 y.o. male seen for follow up of Acute on Chronic Respiratory Failure.  Patient currently is on the T collar on 28% FiO2 has completed 48 hours today  Medications: Reviewed on Rounds  Physical Exam:  Vitals: Temperature 96.7 pulse 84 respiratory 22 blood pressure 138/88 saturations 95%  Ventilator Settings on T collar with an FiO2 of 28%  . General: Comfortable at this time . Eyes: Grossly normal lids, irises & conjunctiva . ENT: grossly tongue is normal . Neck: no obvious mass . Cardiovascular: S1 S2 normal no gallop . Respiratory: No rhonchi very coarse breath . Abdomen: soft . Skin: no rash seen on limited exam . Musculoskeletal: not rigid . Psychiatric:unable to assess . Neurologic: no seizure no involuntary movements         Lab Data:   Basic Metabolic Panel: Recent Labs  Lab 12/24/20 0511 12/26/20 0340 12/27/20 0534  NA  --  142  --   K  --  4.0  --   CL  --  97*  --   CO2  --  36*  --   GLUCOSE  --  156*  --   BUN  --  13  --   CREATININE  --  0.67  --   CALCIUM  --  9.3  --   MG 2.0 2.0 2.0  PHOS 3.9  --  3.7    ABG: No results for input(s): PHART, PCO2ART, PO2ART, HCO3, O2SAT in the last 168 hours.  Liver Function Tests: No results for input(s): AST, ALT, ALKPHOS, BILITOT, PROT, ALBUMIN in the last 168 hours. No results for input(s): LIPASE, AMYLASE in the last 168 hours. No results for input(s): AMMONIA in the last 168 hours.  CBC: Recent Labs  Lab 12/26/20 0340  WBC 8.7  HGB 11.1*  HCT 36.5*  MCV 89.0  PLT 431*    Cardiac Enzymes: No results for input(s): CKTOTAL, CKMB, CKMBINDEX, TROPONINI in the last 168 hours.  BNP (last 3 results) Recent Labs     12/19/20 0541  BNP 83.8    ProBNP (last 3 results) No results for input(s): PROBNP in the last 8760 hours.  Radiological Exams: No results found.  Assessment/Plan Active Problems:   Acute on chronic respiratory failure with hypoxia (HCC)   COVID-19 virus infection   Severe sepsis (HCC)   Acute renal failure due to tubular necrosis (HCC)   Healthcare-associated pneumonia   Acute respiratory distress syndrome (ARDS) due to COVID-19 virus (HCC)   1. Acute on chronic respiratory failure hypoxia we will continue with weaning on T-piece 2. COVID-19 virus infection recovery 3. Severe sepsis treated resolved 4. Acute renal failure no change 5. Healthcare associated pneumonia improving 6. ARDS also improving we will continue to monitor   I have personally seen and evaluated the patient, evaluated laboratory and imaging results, formulated the assessment and plan and placed orders. The Patient requires high complexity decision making with multiple systems involvement.  Rounds were done with the Respiratory Therapy Director and Staff therapists and discussed with nursing staff also.  [name], MD [facility] Pulmonary Critical Care Medicine Sleep Medicine

## 2020-12-31 DIAGNOSIS — N170 Acute kidney failure with tubular necrosis: Secondary | ICD-10-CM | POA: Diagnosis not present

## 2020-12-31 DIAGNOSIS — U071 COVID-19: Secondary | ICD-10-CM | POA: Diagnosis not present

## 2020-12-31 DIAGNOSIS — J189 Pneumonia, unspecified organism: Secondary | ICD-10-CM | POA: Diagnosis not present

## 2020-12-31 DIAGNOSIS — J9621 Acute and chronic respiratory failure with hypoxia: Secondary | ICD-10-CM | POA: Diagnosis not present

## 2020-12-31 LAB — BLOOD GAS, ARTERIAL
Acid-Base Excess: 11.8 mmol/L — ABNORMAL HIGH (ref 0.0–2.0)
Bicarbonate: 36.4 mmol/L — ABNORMAL HIGH (ref 20.0–28.0)
FIO2: 28
O2 Saturation: 96.6 %
Patient temperature: 37
pCO2 arterial: 51.9 mmHg — ABNORMAL HIGH (ref 32.0–48.0)
pH, Arterial: 7.46 — ABNORMAL HIGH (ref 7.350–7.450)
pO2, Arterial: 82.3 mmHg — ABNORMAL LOW (ref 83.0–108.0)

## 2020-12-31 NOTE — Progress Notes (Signed)
Pulmonary Critical Care Medicine [facility] [place]   PULMONARY CRITICAL CARE SERVICE  PROGRESS NOTE  Date of Service: 12/31/2020  Charles Levy.  [id number]  DOB: 01/11/1971   DOA: 12/02/2020  Referring Physician: Carron Curie, MD  HPI: Charles Stidd. is a 50 y.o. male seen for follow up of Acute on Chronic Respiratory Failure.  Patient currently is on T collar has been on 28% FiO2  Medications: Reviewed on Rounds  Physical Exam:  Vitals: Temperature is 97.3 pulse 88 respiratory 23 blood pressure is 127/85 saturations 97%  Ventilator Settings on T collar with an FiO2 of 28%  . General: Comfortable at this time . Eyes: Grossly normal lids, irises & conjunctiva . ENT: grossly tongue is normal . Neck: no obvious mass . Cardiovascular: S1 S2 normal no gallop . Respiratory: Scattered rhonchi expansion is it equal . Abdomen: soft . Skin: no rash seen on limited exam . Musculoskeletal: not rigid . Psychiatric:unable to assess . Neurologic: no seizure no involuntary movements         Lab Data:   Basic Metabolic Panel: Recent Labs  Lab 12/26/20 0340 12/27/20 0534  NA 142  --   K 4.0  --   CL 97*  --   CO2 36*  --   GLUCOSE 156*  --   BUN 13  --   CREATININE 0.67  --   CALCIUM 9.3  --   MG 2.0 2.0  PHOS  --  3.7    ABG: Recent Labs  Lab 12/31/20 0850  PHART 7.460*  PCO2ART 51.9*  PO2ART 82.3*  HCO3 36.4*  O2SAT 96.6    Liver Function Tests: No results for input(s): AST, ALT, ALKPHOS, BILITOT, PROT, ALBUMIN in the last 168 hours. No results for input(s): LIPASE, AMYLASE in the last 168 hours. No results for input(s): AMMONIA in the last 168 hours.  CBC: Recent Labs  Lab 12/26/20 0340  WBC 8.7  HGB 11.1*  HCT 36.5*  MCV 89.0  PLT 431*    Cardiac Enzymes: No results for input(s): CKTOTAL, CKMB, CKMBINDEX, TROPONINI in the last 168 hours.  BNP (last 3 results) Recent Labs    12/19/20 0541  BNP 83.8    ProBNP (last 3  results) No results for input(s): PROBNP in the last 8760 hours.  Radiological Exams: No results found.  Assessment/Plan Active Problems:   Acute on chronic respiratory failure with hypoxia (HCC)   COVID-19 virus infection   Severe sepsis (HCC)   Acute renal failure due to tubular necrosis (HCC)   Healthcare-associated pneumonia   Acute respiratory distress syndrome (ARDS) due to COVID-19 virus (HCC)   1. Acute on chronic respiratory failure with hypoxia plan is to continue with T collar patient is on 48-hour goal 2. COVID-19 virus infection in recovery we will continue to follow 3. Severe sepsis treated resolved 4. Acute renal failure supportive care monitor labs 5. Healthcare associated pneumonia treated 6. ARDS improving slowly   I have personally seen and evaluated the patient, evaluated laboratory and imaging results, formulated the assessment and plan and placed orders. The Patient requires high complexity decision making with multiple systems involvement.  Rounds were done with the Respiratory Therapy Director and Staff therapists and discussed with nursing staff also.  [name], MD [facility] Pulmonary Critical Care Medicine Sleep Medicine

## 2021-01-01 DIAGNOSIS — N170 Acute kidney failure with tubular necrosis: Secondary | ICD-10-CM | POA: Diagnosis not present

## 2021-01-01 DIAGNOSIS — J189 Pneumonia, unspecified organism: Secondary | ICD-10-CM | POA: Diagnosis not present

## 2021-01-01 DIAGNOSIS — J9621 Acute and chronic respiratory failure with hypoxia: Secondary | ICD-10-CM | POA: Diagnosis not present

## 2021-01-01 DIAGNOSIS — U071 COVID-19: Secondary | ICD-10-CM | POA: Diagnosis not present

## 2021-01-01 NOTE — Progress Notes (Signed)
Pulmonary Critical Care Medicine [facility] [place]   PULMONARY CRITICAL CARE SERVICE  PROGRESS NOTE  Date of Service: 01/01/2021  Charles Levy.  [id number]  DOB: Sep 18, 1971   DOA: 12/02/2020  Referring Physician: Carron Curie, MD  HPI: Charles Levy. is a 50 y.o. male seen for follow up of Acute on Chronic Respiratory Failure.  Patient currently is on T collar has been on 28% FiO2 no major changes are noted at this time  Medications: Reviewed on Rounds  Physical Exam:  Vitals: Temperature 97.2 pulse 91 respiratory 25 blood pressure is 129/78 saturations 96%  Ventilator Settings off the ventilator on T collar currently on 20% FiO2  . General: Comfortable at this time . Eyes: Grossly normal lids, irises & conjunctiva . ENT: grossly tongue is normal . Neck: no obvious mass . Cardiovascular: S1 S2 normal no gallop . Respiratory: Scattered rhonchi noted bilaterally . Abdomen: soft . Skin: no rash seen on limited exam . Musculoskeletal: not rigid . Psychiatric:unable to assess . Neurologic: no seizure no involuntary movements         Lab Data:   Basic Metabolic Panel: Recent Labs  Lab 12/26/20 0340 12/27/20 0534  NA 142  --   K 4.0  --   CL 97*  --   CO2 36*  --   GLUCOSE 156*  --   BUN 13  --   CREATININE 0.67  --   CALCIUM 9.3  --   MG 2.0 2.0  PHOS  --  3.7    ABG: Recent Labs  Lab 12/31/20 0850  PHART 7.460*  PCO2ART 51.9*  PO2ART 82.3*  HCO3 36.4*  O2SAT 96.6    Liver Function Tests: No results for input(s): AST, ALT, ALKPHOS, BILITOT, PROT, ALBUMIN in the last 168 hours. No results for input(s): LIPASE, AMYLASE in the last 168 hours. No results for input(s): AMMONIA in the last 168 hours.  CBC: Recent Labs  Lab 12/26/20 0340  WBC 8.7  HGB 11.1*  HCT 36.5*  MCV 89.0  PLT 431*    Cardiac Enzymes: No results for input(s): CKTOTAL, CKMB, CKMBINDEX, TROPONINI in the last 168 hours.  BNP (last 3 results) Recent  Labs    12/19/20 0541  BNP 83.8    ProBNP (last 3 results) No results for input(s): PROBNP in the last 8760 hours.  Radiological Exams: No results found.  Assessment/Plan Active Problems:   Acute on chronic respiratory failure with hypoxia (HCC)   COVID-19 virus infection   Severe sepsis (HCC)   Acute renal failure due to tubular necrosis (HCC)   Healthcare-associated pneumonia   Acute respiratory distress syndrome (ARDS) due to COVID-19 virus (HCC)   1. Acute on chronic respiratory failure hypoxia plan is going to be to continue with the weaning on 28% FiO2.  Patient is also ready for trach change to a cuffless trach 2. COVID-19 virus infection in recovery 3. Severe sepsis treated resolving 4. Healthcare associated pneumonia slow improvement 5. ARDS no change    I have personally seen and evaluated the patient, evaluated laboratory and imaging results, formulated the assessment and plan and placed orders. The Patient requires high complexity decision making with multiple systems involvement.  Rounds were done with the Respiratory Therapy Director and Staff therapists and discussed with nursing staff also.  Charles Pax, MD [facility] Pulmonary Critical Care Medicine Sleep Medicine

## 2021-01-02 LAB — CBC
HCT: 40.7 % (ref 39.0–52.0)
Hemoglobin: 12.5 g/dL — ABNORMAL LOW (ref 13.0–17.0)
MCH: 27.1 pg (ref 26.0–34.0)
MCHC: 30.7 g/dL (ref 30.0–36.0)
MCV: 88.1 fL (ref 80.0–100.0)
Platelets: 729 10*3/uL — ABNORMAL HIGH (ref 150–400)
RBC: 4.62 MIL/uL (ref 4.22–5.81)
RDW: 18 % — ABNORMAL HIGH (ref 11.5–15.5)
WBC: 10.8 10*3/uL — ABNORMAL HIGH (ref 4.0–10.5)
nRBC: 0 % (ref 0.0–0.2)

## 2021-01-02 LAB — BASIC METABOLIC PANEL
Anion gap: 7 (ref 5–15)
BUN: 11 mg/dL (ref 6–20)
CO2: 32 mmol/L (ref 22–32)
Calcium: 9.5 mg/dL (ref 8.9–10.3)
Chloride: 102 mmol/L (ref 98–111)
Creatinine, Ser: 0.62 mg/dL (ref 0.61–1.24)
GFR, Estimated: >60 mL/min (ref >60)
Glucose, Bld: 144 mg/dL — ABNORMAL HIGH (ref 70–99)
Potassium: 3.9 mmol/L (ref 3.5–5.1)
Sodium: 141 mmol/L (ref 135–145)

## 2021-01-02 LAB — MAGNESIUM: Magnesium: 1.9 mg/dL (ref 1.7–2.4)

## 2021-01-03 DIAGNOSIS — J189 Pneumonia, unspecified organism: Secondary | ICD-10-CM | POA: Diagnosis not present

## 2021-01-03 DIAGNOSIS — N170 Acute kidney failure with tubular necrosis: Secondary | ICD-10-CM | POA: Diagnosis not present

## 2021-01-03 DIAGNOSIS — U071 COVID-19: Secondary | ICD-10-CM | POA: Diagnosis not present

## 2021-01-03 DIAGNOSIS — J9621 Acute and chronic respiratory failure with hypoxia: Secondary | ICD-10-CM | POA: Diagnosis not present

## 2021-01-03 NOTE — Progress Notes (Signed)
Pulmonary Critical Care Medicine [facility] [place]   PULMONARY CRITICAL CARE SERVICE  PROGRESS NOTE  Date of Service: 01/03/2021  Charles Levy.  [id number]  DOB: October 03, 1971   DOA: 12/02/2020  Referring Physician: Carron Curie, MD  HPI: Charles Wadsworth. is a 50 y.o. male seen for follow up of Acute on Chronic Respiratory Failure.  Patient is off the ventilator right now on T collar has been on 40% FiO2  Medications: Reviewed on Rounds  Physical Exam:  Vitals: Temperature is 98.0 pulse 94 respiratory 28 blood pressure is 115/76 saturations 97%  Ventilator Settings off the ventilator on T collar FiO2 of 40%  . General: Comfortable at this time . Eyes: Grossly normal lids, irises & conjunctiva . ENT: grossly tongue is normal . Neck: no obvious mass . Cardiovascular: S1 S2 normal no gallop . Respiratory: No rhonchi very coarse breath sounds . Abdomen: soft . Skin: no rash seen on limited exam . Musculoskeletal: not rigid . Psychiatric:unable to assess . Neurologic: no seizure no involuntary movements         Lab Data:   Basic Metabolic Panel: Recent Labs  Lab 01/02/21 0618  NA 141  K 3.9  CL 102  CO2 32  GLUCOSE 144*  BUN 11  CREATININE 0.62  CALCIUM 9.5  MG 1.9    ABG: Recent Labs  Lab 12/31/20 0850  PHART 7.460*  PCO2ART 51.9*  PO2ART 82.3*  HCO3 36.4*  O2SAT 96.6    Liver Function Tests: No results for input(s): AST, ALT, ALKPHOS, BILITOT, PROT, ALBUMIN in the last 168 hours. No results for input(s): LIPASE, AMYLASE in the last 168 hours. No results for input(s): AMMONIA in the last 168 hours.  CBC: Recent Labs  Lab 01/02/21 0618  WBC 10.8*  HGB 12.5*  HCT 40.7  MCV 88.1  PLT 729*    Cardiac Enzymes: No results for input(s): CKTOTAL, CKMB, CKMBINDEX, TROPONINI in the last 168 hours.  BNP (last 3 results) Recent Labs    12/19/20 0541  BNP 83.8    ProBNP (last 3 results) No results for input(s): PROBNP in the  last 8760 hours.  Radiological Exams: No results found.  Assessment/Plan Active Problems:   Acute on chronic respiratory failure with hypoxia (HCC)   COVID-19 virus infection   Severe sepsis (HCC)   Acute renal failure due to tubular necrosis (HCC)   Healthcare-associated pneumonia   Acute respiratory distress syndrome (ARDS) due to COVID-19 virus (HCC)   1. Acute on chronic respiratory failure hypoxia plan is to continue with T collar trials patient still has copious secretions 2. COVID-19 virus infection recovery we will continue to follow along 3. Severe sepsis treated resolving 4. Acute renal failure supportive care 5. Healthcare associated pneumonia baseline 6. ARDS slow to improve   I have personally seen and evaluated the patient, evaluated laboratory and imaging results, formulated the assessment and plan and placed orders. The Patient requires high complexity decision making with multiple systems involvement.  Rounds were done with the Respiratory Therapy Director and Staff therapists and discussed with nursing staff also.  [name], MD [facility] Pulmonary Critical Care Medicine Sleep Medicine

## 2021-01-04 DIAGNOSIS — J189 Pneumonia, unspecified organism: Secondary | ICD-10-CM | POA: Diagnosis not present

## 2021-01-04 DIAGNOSIS — J9621 Acute and chronic respiratory failure with hypoxia: Secondary | ICD-10-CM | POA: Diagnosis not present

## 2021-01-04 DIAGNOSIS — U071 COVID-19: Secondary | ICD-10-CM | POA: Diagnosis not present

## 2021-01-04 DIAGNOSIS — N170 Acute kidney failure with tubular necrosis: Secondary | ICD-10-CM | POA: Diagnosis not present

## 2021-01-04 NOTE — Progress Notes (Signed)
Pulmonary Critical Care Medicine [facility] [place]   PULMONARY CRITICAL CARE SERVICE  PROGRESS NOTE  Date of Service: 01/04/2021  Charles Levy.  [id number]  DOB: 28-Aug-1971   DOA: 12/02/2020  Referring Physician: Carron Curie, MD  HPI: Charles Levy. is a 50 y.o. male seen for follow up of Acute on Chronic Respiratory Failure.  Charles Levy is resting comfortably right now afebrile has been off the ventilator on T collar and 35% FiO2 with a cuffless trach in place  Medications: Reviewed on Rounds  Physical Exam:  Vitals: Temperature is 98.4 pulse 94 respiratory 22 blood pressure is 150/90 saturations 99%  Ventilator Settings on T collar with an FiO2 of 35%  . General: Comfortable at this time . Eyes: Grossly normal lids, irises & conjunctiva . ENT: grossly tongue is normal . Neck: no obvious mass . Cardiovascular: S1 S2 normal no gallop . Respiratory: No rhonchi very coarse breath sound . Abdomen: soft . Skin: no rash seen on limited exam . Musculoskeletal: not rigid . Psychiatric:unable to assess . Neurologic: no seizure no involuntary movements         Lab Data:   Basic Metabolic Panel: Recent Labs  Lab 01/02/21 0618  NA 141  K 3.9  CL 102  CO2 32  GLUCOSE 144*  BUN 11  CREATININE 0.62  CALCIUM 9.5  MG 1.9    ABG: Recent Labs  Lab 12/31/20 0850  PHART 7.460*  PCO2ART 51.9*  PO2ART 82.3*  HCO3 36.4*  O2SAT 96.6    Liver Function Tests: No results for input(s): AST, ALT, ALKPHOS, BILITOT, PROT, ALBUMIN in the last 168 hours. No results for input(s): LIPASE, AMYLASE in the last 168 hours. No results for input(s): AMMONIA in the last 168 hours.  CBC: Recent Labs  Lab 01/02/21 0618  WBC 10.8*  HGB 12.5*  HCT 40.7  MCV 88.1  PLT 729*    Cardiac Enzymes: No results for input(s): CKTOTAL, CKMB, CKMBINDEX, TROPONINI in the last 168 hours.  BNP (last 3 results) Recent Labs    12/19/20 0541  BNP 83.8    ProBNP  (last 3 results) No results for input(s): PROBNP in the last 8760 hours.  Radiological Exams: No results found.  Assessment/Plan Active Problems:   Acute on chronic respiratory failure with hypoxia (HCC)   COVID-19 virus infection   Severe sepsis (HCC)   Acute renal failure due to tubular necrosis (HCC)   Healthcare-associated pneumonia   Acute respiratory distress syndrome (ARDS) due to COVID-19 virus (HCC)   1. Acute on chronic respiratory failure hypoxia we will continue with T-piece patient on 35% FiO2 continue secretion management supportive care. 2. COVID-19 virus infection recovery we will continue to follow along. 3. Severe sepsis treated slowly improving 4. Acute renal failure no change 5. Healthcare associated pneumonia at baseline 6. ARDS at baseline supportive care   I have personally seen and evaluated the patient, evaluated laboratory and imaging results, formulated the assessment and plan and placed orders. The Patient requires high complexity decision making with multiple systems involvement.  Rounds were done with the Respiratory Therapy Director and Staff therapists and discussed with nursing staff also.  [name], MD [facility] Pulmonary Critical Care Medicine Sleep Medicine

## 2021-01-05 DIAGNOSIS — U071 COVID-19: Secondary | ICD-10-CM | POA: Diagnosis not present

## 2021-01-05 DIAGNOSIS — N170 Acute kidney failure with tubular necrosis: Secondary | ICD-10-CM | POA: Diagnosis not present

## 2021-01-05 DIAGNOSIS — J189 Pneumonia, unspecified organism: Secondary | ICD-10-CM | POA: Diagnosis not present

## 2021-01-05 DIAGNOSIS — J9621 Acute and chronic respiratory failure with hypoxia: Secondary | ICD-10-CM | POA: Diagnosis not present

## 2021-01-05 NOTE — Progress Notes (Signed)
Pulmonary Critical Care Medicine [facility] [place]   PULMONARY CRITICAL CARE SERVICE  PROGRESS NOTE  Date of Service: 01/05/2021  [name]  [id number]  DOB: 1971-08-31   DOA: 12/02/2020  Referring Physician: Carron Curie, MD  HPI: Maycol Hoying. is a 50 y.o. male seen for follow up of Acute on Chronic Respiratory Failure.  Patient is comfortable right now without distress has been on T collar requiring 35% FiO2  Medications: Reviewed on Rounds  Physical Exam:  Vitals: Temperature is 98.2 pulse 80 respiratory 20 blood pressure is 120/83 saturations 99%  Ventilator Settings on T collar FiO2 35%  . General: Comfortable at this time . Eyes: Grossly normal lids, irises & conjunctiva . ENT: grossly tongue is normal . Neck: no obvious mass . Cardiovascular: S1 S2 normal no gallop . Respiratory: Scattered rhonchi expansion is equal . Abdomen: soft . Skin: no rash seen on limited exam . Musculoskeletal: not rigid . Psychiatric:unable to assess . Neurologic: no seizure no involuntary movements         Lab Data:   Basic Metabolic Panel: Recent Labs  Lab 01/02/21 0618  NA 141  K 3.9  CL 102  CO2 32  GLUCOSE 144*  BUN 11  CREATININE 0.62  CALCIUM 9.5  MG 1.9    ABG: Recent Labs  Lab 12/31/20 0850  PHART 7.460*  PCO2ART 51.9*  PO2ART 82.3*  HCO3 36.4*  O2SAT 96.6    Liver Function Tests: No results for input(s): AST, ALT, ALKPHOS, BILITOT, PROT, ALBUMIN in the last 168 hours. No results for input(s): LIPASE, AMYLASE in the last 168 hours. No results for input(s): AMMONIA in the last 168 hours.  CBC: Recent Labs  Lab 01/02/21 0618  WBC 10.8*  HGB 12.5*  HCT 40.7  MCV 88.1  PLT 729*    Cardiac Enzymes: No results for input(s): CKTOTAL, CKMB, CKMBINDEX, TROPONINI in the last 168 hours.  BNP (last 3 results) Recent Labs    12/19/20 0541  BNP 83.8    ProBNP (last 3 results) No results for input(s): PROBNP in the last  8760 hours.  Radiological Exams: No results found.  Assessment/Plan Active Problems:   Acute on chronic respiratory failure with hypoxia (HCC)   COVID-19 virus infection   Severe sepsis (HCC)   Acute renal failure due to tubular necrosis (HCC)   Healthcare-associated pneumonia   Acute respiratory distress syndrome (ARDS) due to COVID-19 virus (HCC)   1. Acute on chronic respiratory failure hypoxia we will continue with T-piece patient requiring 35% FiO2 2. COVID-19 virus infection.  Continue to follow 3. Severe sepsis treated resolved 4. Acute renal failure no change 5. Healthcare associated pneumonia patient is at baseline 6. ARDS patient is at baseline right now slow improvement noted   I have personally seen and evaluated the patient, evaluated laboratory and imaging results, formulated the assessment and plan and placed orders. The Patient requires high complexity decision making with multiple systems involvement.  Rounds were done with the Respiratory Therapy Director and Staff therapists and discussed with nursing staff also.  [name], MD [facility] Pulmonary Critical Care Medicine Sleep Medicine

## 2021-01-06 DIAGNOSIS — J189 Pneumonia, unspecified organism: Secondary | ICD-10-CM | POA: Diagnosis not present

## 2021-01-06 DIAGNOSIS — U071 COVID-19: Secondary | ICD-10-CM | POA: Diagnosis not present

## 2021-01-06 DIAGNOSIS — N170 Acute kidney failure with tubular necrosis: Secondary | ICD-10-CM | POA: Diagnosis not present

## 2021-01-06 DIAGNOSIS — J9621 Acute and chronic respiratory failure with hypoxia: Secondary | ICD-10-CM | POA: Diagnosis not present

## 2021-01-06 LAB — CBC
HCT: 42.7 % (ref 39.0–52.0)
Hemoglobin: 13 g/dL (ref 13.0–17.0)
MCH: 27.1 pg (ref 26.0–34.0)
MCHC: 30.4 g/dL (ref 30.0–36.0)
MCV: 89 fL (ref 80.0–100.0)
Platelets: 636 10*3/uL — ABNORMAL HIGH (ref 150–400)
RBC: 4.8 MIL/uL (ref 4.22–5.81)
RDW: 17.5 % — ABNORMAL HIGH (ref 11.5–15.5)
WBC: 12 10*3/uL — ABNORMAL HIGH (ref 4.0–10.5)
nRBC: 0 % (ref 0.0–0.2)

## 2021-01-06 LAB — COMPREHENSIVE METABOLIC PANEL
ALT: 14 U/L (ref 0–44)
AST: 19 U/L (ref 15–41)
Albumin: 2.6 g/dL — ABNORMAL LOW (ref 3.5–5.0)
Alkaline Phosphatase: 126 U/L (ref 38–126)
Anion gap: 8 (ref 5–15)
BUN: 16 mg/dL (ref 6–20)
CO2: 32 mmol/L (ref 22–32)
Calcium: 9.7 mg/dL (ref 8.9–10.3)
Chloride: 108 mmol/L (ref 98–111)
Creatinine, Ser: 0.59 mg/dL — ABNORMAL LOW (ref 0.61–1.24)
GFR, Estimated: >60 mL/min (ref >60)
Glucose, Bld: 143 mg/dL — ABNORMAL HIGH (ref 70–99)
Potassium: 3.9 mmol/L (ref 3.5–5.1)
Sodium: 148 mmol/L — ABNORMAL HIGH (ref 135–145)
Total Bilirubin: 0.9 mg/dL (ref 0.3–1.2)
Total Protein: 7.4 g/dL (ref 6.5–8.1)

## 2021-01-06 NOTE — Progress Notes (Signed)
Pulmonary Critical Care Medicine [facility] [place]   PULMONARY CRITICAL CARE SERVICE  PROGRESS NOTE  Date of Service: 01/06/2021  Charles Levy.  [id number]  DOB: January 18, 1971   DOA: 12/02/2020  Referring Physician: Carron Curie, MD  HPI: Charles Levy. is a 50 y.o. male seen for follow up of Acute on Chronic Respiratory Failure.  Patient is currently on T collar has been on 35% FiO2 patient is ready for capping trials  Medications: Reviewed on Rounds  Physical Exam:  Vitals: Temperature is 98.1 pulse 96 respiratory 29 blood pressure is 121/71 saturations 94%  Ventilator Settings right now is supposed to start capping right now is on T collar  . General: Comfortable at this time . Eyes: Grossly normal lids, irises & conjunctiva . ENT: grossly tongue is normal . Neck: no obvious mass . Cardiovascular: S1 S2 normal no gallop . Respiratory: Scattered rhonchi expansion is equal . Abdomen: soft . Skin: no rash seen on limited exam . Musculoskeletal: not rigid . Psychiatric:unable to assess . Neurologic: no seizure no involuntary movements         Lab Data:   Basic Metabolic Panel: Recent Labs  Lab 01/02/21 0618 01/06/21 0419  NA 141 148*  K 3.9 3.9  CL 102 108  CO2 32 32  GLUCOSE 144* 143*  BUN 11 16  CREATININE 0.62 0.59*  CALCIUM 9.5 9.7  MG 1.9  --     ABG: Recent Labs  Lab 12/31/20 0850  PHART 7.460*  PCO2ART 51.9*  PO2ART 82.3*  HCO3 36.4*  O2SAT 96.6    Liver Function Tests: Recent Labs  Lab 01/06/21 0419  AST 19  ALT 14  ALKPHOS 126  BILITOT 0.9  PROT 7.4  ALBUMIN 2.6*   No results for input(s): LIPASE, AMYLASE in the last 168 hours. No results for input(s): AMMONIA in the last 168 hours.  CBC: Recent Labs  Lab 01/02/21 0618 01/06/21 0419  WBC 10.8* 12.0*  HGB 12.5* 13.0  HCT 40.7 42.7  MCV 88.1 89.0  PLT 729* 636*    Cardiac Enzymes: No results for input(s): CKTOTAL, CKMB, CKMBINDEX, TROPONINI in the  last 168 hours.  BNP (last 3 results) Recent Labs    12/19/20 0541  BNP 83.8    ProBNP (last 3 results) No results for input(s): PROBNP in the last 8760 hours.  Radiological Exams: No results found.  Assessment/Plan Active Problems:   Acute on chronic respiratory failure with hypoxia (HCC)   COVID-19 virus infection   Severe sepsis (HCC)   Acute renal failure due to tubular necrosis (HCC)   Healthcare-associated pneumonia   Acute respiratory distress syndrome (ARDS) due to COVID-19 virus (HCC)   1. Acute on chronic respiratory failure hypoxia plan is to continue to advance the wean patient supposed start capping 2. COVID-19 virus infection recovery phase 3. Severe sepsis treated slow improvement 4. Acute renal failure supportive care 5. Healthcare associated pneumonia treated 6. Acute respiratory distress no change   I have personally seen and evaluated the patient, evaluated laboratory and imaging results, formulated the assessment and plan and placed orders. The Patient requires high complexity decision making with multiple systems involvement.  Rounds were done with the Respiratory Therapy Director and Staff therapists and discussed with nursing staff also.  [name], MD [facility] Pulmonary Critical Care Medicine Sleep Medicine

## 2021-01-07 DIAGNOSIS — J9621 Acute and chronic respiratory failure with hypoxia: Secondary | ICD-10-CM | POA: Diagnosis not present

## 2021-01-07 DIAGNOSIS — J189 Pneumonia, unspecified organism: Secondary | ICD-10-CM | POA: Diagnosis not present

## 2021-01-07 DIAGNOSIS — N170 Acute kidney failure with tubular necrosis: Secondary | ICD-10-CM | POA: Diagnosis not present

## 2021-01-07 DIAGNOSIS — U071 COVID-19: Secondary | ICD-10-CM | POA: Diagnosis not present

## 2021-01-07 NOTE — Progress Notes (Signed)
Pulmonary Critical Care Medicine [facility] [place]   PULMONARY CRITICAL CARE SERVICE  PROGRESS NOTE  Date of Service: 01/07/2021  Charles Levy.  EQA:[id number]  DOB: 04-Aug-1971   DOA: 12/02/2020  Referring Physician: Carron Curie, MD  HPI: Charles Levy. is a 50 y.o. male seen for follow up of Acute on Chronic Respiratory Failure.  Capping on 3 L today will be completing 24 hours doing well so far  Medications: Reviewed on Rounds  Physical Exam:  Vitals: Temperature is 96.2 pulse 98 respiratory 20 blood pressure is 125/83 saturations 94%  Ventilator Settings capping on 3 L O2  . General: Comfortable at this time . Eyes: Grossly normal lids, irises & conjunctiva . ENT: grossly tongue is normal . Neck: no obvious mass . Cardiovascular: S1 S2 normal no gallop . Respiratory: No rhonchi very coarse breath sounds . Abdomen: soft . Skin: no rash seen on limited exam . Musculoskeletal: not rigid . Psychiatric:unable to assess . Neurologic: no seizure no involuntary movements         Lab Data:   Basic Metabolic Panel: Recent Labs  Lab 01/02/21 0618 01/06/21 0419  NA 141 148*  K 3.9 3.9  CL 102 108  CO2 32 32  GLUCOSE 144* 143*  BUN 11 16  CREATININE 0.62 0.59*  CALCIUM 9.5 9.7  MG 1.9  --     ABG: Recent Labs  Lab 12/31/20 0850  PHART 7.460*  PCO2ART 51.9*  PO2ART 82.3*  HCO3 36.4*  O2SAT 96.6    Liver Function Tests: Recent Labs  Lab 01/06/21 0419  AST 19  ALT 14  ALKPHOS 126  BILITOT 0.9  PROT 7.4  ALBUMIN 2.6*   No results for input(s): LIPASE, AMYLASE in the last 168 hours. No results for input(s): AMMONIA in the last 168 hours.  CBC: Recent Labs  Lab 01/02/21 0618 01/06/21 0419  WBC 10.8* 12.0*  HGB 12.5* 13.0  HCT 40.7 42.7  MCV 88.1 89.0  PLT 729* 636*    Cardiac Enzymes: No results for input(s): CKTOTAL, CKMB, CKMBINDEX, TROPONINI in the last 168 hours.  BNP (last 3 results) Recent Labs     12/19/20 0541  BNP 83.8    ProBNP (last 3 results) No results for input(s): PROBNP in the last 8760 hours.  Radiological Exams: No results found.  Assessment/Plan Active Problems:   Acute on chronic respiratory failure with hypoxia (HCC)   COVID-19 virus infection   Severe sepsis (HCC)   Acute renal failure due to tubular necrosis (HCC)   Healthcare-associated pneumonia   Acute respiratory distress syndrome (ARDS) due to COVID-19 virus (HCC)   1. Acute on chronic respiratory failure hypoxia we will continue with capping trials completing 24 hours today 2. COVID-19 virus infection recovery phase 3. Severe sepsis treated resolving 4. Acute renal failure tubular necrosis no change 5. Healthcare associated pneumonia treated 6. ARDS slow improvement   I have personally seen and evaluated the patient, evaluated laboratory and imaging results, formulated the assessment and plan and placed orders. The Patient requires high complexity decision making with multiple systems involvement.  Rounds were done with the Respiratory Therapy Director and Staff therapists and discussed with nursing staff also.  [name], MD [facility] Pulmonary Critical Care Medicine Sleep Medicine

## 2021-01-08 DIAGNOSIS — J9621 Acute and chronic respiratory failure with hypoxia: Secondary | ICD-10-CM | POA: Diagnosis not present

## 2021-01-08 DIAGNOSIS — N170 Acute kidney failure with tubular necrosis: Secondary | ICD-10-CM | POA: Diagnosis not present

## 2021-01-08 DIAGNOSIS — J189 Pneumonia, unspecified organism: Secondary | ICD-10-CM | POA: Diagnosis not present

## 2021-01-08 DIAGNOSIS — U071 COVID-19: Secondary | ICD-10-CM | POA: Diagnosis not present

## 2021-01-08 NOTE — Progress Notes (Signed)
Pulmonary Critical Care Medicine [facility] [place]   PULMONARY CRITICAL CARE SERVICE  PROGRESS NOTE  Date of Service: 01/08/2021  [name]  [id number]  DOB: 11/21/1970   DOA: 12/02/2020  Referring Physician: Carron Curie, MD  HPI: Charles Levy. is a 50 y.o. male seen for follow up of Acute on Chronic Respiratory Failure.  Patient is capping doing well today will be completing 48 hours looking towards decannulation  Medications: Reviewed on Rounds  Physical Exam:  Vitals: Temperature is 97.6 pulse 101 respiratory 26 blood pressure 142/72 saturations 98  Ventilator Settings capping off the ventilator  . General: Comfortable at this time . Eyes: Grossly normal lids, irises & conjunctiva . ENT: grossly tongue is normal . Neck: no obvious mass . Cardiovascular: S1 S2 normal no gallop . Respiratory: Scattered rhonchi expansion is equal . Abdomen: soft . Skin: no rash seen on limited exam . Musculoskeletal: not rigid . Psychiatric:unable to assess . Neurologic: no seizure no involuntary movements         Lab Data:   Basic Metabolic Panel: Recent Labs  Lab 01/02/21 0618 01/06/21 0419  NA 141 148*  K 3.9 3.9  CL 102 108  CO2 32 32  GLUCOSE 144* 143*  BUN 11 16  CREATININE 0.62 0.59*  CALCIUM 9.5 9.7  MG 1.9  --     ABG: No results for input(s): PHART, PCO2ART, PO2ART, HCO3, O2SAT in the last 168 hours.  Liver Function Tests: Recent Labs  Lab 01/06/21 0419  AST 19  ALT 14  ALKPHOS 126  BILITOT 0.9  PROT 7.4  ALBUMIN 2.6*   No results for input(s): LIPASE, AMYLASE in the last 168 hours. No results for input(s): AMMONIA in the last 168 hours.  CBC: Recent Labs  Lab 01/02/21 0618 01/06/21 0419  WBC 10.8* 12.0*  HGB 12.5* 13.0  HCT 40.7 42.7  MCV 88.1 89.0  PLT 729* 636*    Cardiac Enzymes: No results for input(s): CKTOTAL, CKMB, CKMBINDEX, TROPONINI in the last 168 hours.  BNP (last 3 results) Recent Labs     12/19/20 0541  BNP 83.8    ProBNP (last 3 results) No results for input(s): PROBNP in the last 8760 hours.  Radiological Exams: No results found.  Assessment/Plan Active Problems:   Acute on chronic respiratory failure with hypoxia (HCC)   COVID-19 virus infection   Severe sepsis (HCC)   Acute renal failure due to tubular necrosis (HCC)   Healthcare-associated pneumonia   Acute respiratory distress syndrome (ARDS) due to COVID-19 virus (HCC)   1. Acute on chronic respiratory failure hypoxia we will proceed with working towards decannulation. 2. COVID-19 virus infection recovery phase 3. Severe sepsis treated slowly improving 4. Acute renal failure due to necrosis no change we will continue present management 5. Healthcare associated pneumonia at baseline 6. ARDS treated slowly improved   I have personally seen and evaluated the patient, evaluated laboratory and imaging results, formulated the assessment and plan and placed orders. The Patient requires high complexity decision making with multiple systems involvement.  Rounds were done with the Respiratory Therapy Director and Staff therapists and discussed with nursing staff also.  [name], MD [facility] Pulmonary Critical Care Medicine Sleep Medicine

## 2021-01-09 ENCOUNTER — Other Ambulatory Visit (HOSPITAL_COMMUNITY): Payer: Medicare Other

## 2021-01-09 DIAGNOSIS — J9621 Acute and chronic respiratory failure with hypoxia: Secondary | ICD-10-CM | POA: Diagnosis not present

## 2021-01-09 DIAGNOSIS — J189 Pneumonia, unspecified organism: Secondary | ICD-10-CM | POA: Diagnosis not present

## 2021-01-09 DIAGNOSIS — U071 COVID-19: Secondary | ICD-10-CM | POA: Diagnosis not present

## 2021-01-09 DIAGNOSIS — N170 Acute kidney failure with tubular necrosis: Secondary | ICD-10-CM | POA: Diagnosis not present

## 2021-01-09 LAB — BASIC METABOLIC PANEL
Anion gap: 6 (ref 5–15)
BUN: 18 mg/dL (ref 6–20)
CO2: 30 mmol/L (ref 22–32)
Calcium: 9.7 mg/dL (ref 8.9–10.3)
Chloride: 110 mmol/L (ref 98–111)
Creatinine, Ser: 0.67 mg/dL (ref 0.61–1.24)
GFR, Estimated: >60 mL/min (ref >60)
Glucose, Bld: 220 mg/dL — ABNORMAL HIGH (ref 70–99)
Potassium: 3.7 mmol/L (ref 3.5–5.1)
Sodium: 146 mmol/L — ABNORMAL HIGH (ref 135–145)

## 2021-01-09 NOTE — Progress Notes (Signed)
Pulmonary Critical Care Medicine [facility] [place]   PULMONARY CRITICAL CARE SERVICE  PROGRESS NOTE  Date of Service: 01/09/2021  Charles Levy.  [id number]  DOB: 13-Feb-1971   DOA: 12/02/2020  Referring Physician: Carron Curie, MD  HPI: Charles Levy. is a 50 y.o. male seen for follow up of Acute on Chronic Respiratory Failure.  Patient has low-grade fever without distress has been capping doing very well has been capping for more than 3 days now  Medications: Reviewed on Rounds  Physical Exam:  Vitals: Temperature is 99.0 pulse 99 respiratory 20 blood pressure is 114/79 saturations 95%  Ventilator Settings capping off the ventilator  . General: Comfortable at this time . Eyes: Grossly normal lids, irises & conjunctiva . ENT: grossly tongue is normal . Neck: no obvious mass . Cardiovascular: S1 S2 normal no gallop . Respiratory: No rhonchi very coarse breath sound . Abdomen: soft . Skin: no rash seen on limited exam . Musculoskeletal: not rigid . Psychiatric:unable to assess . Neurologic: no seizure no involuntary movements         Lab Data:   Basic Metabolic Panel: Recent Labs  Lab 01/06/21 0419 01/09/21 0246  NA 148* 146*  K 3.9 3.7  CL 108 110  CO2 32 30  GLUCOSE 143* 220*  BUN 16 18  CREATININE 0.59* 0.67  CALCIUM 9.7 9.7    ABG: No results for input(s): PHART, PCO2ART, PO2ART, HCO3, O2SAT in the last 168 hours.  Liver Function Tests: Recent Labs  Lab 01/06/21 0419  AST 19  ALT 14  ALKPHOS 126  BILITOT 0.9  PROT 7.4  ALBUMIN 2.6*   No results for input(s): LIPASE, AMYLASE in the last 168 hours. No results for input(s): AMMONIA in the last 168 hours.  CBC: Recent Labs  Lab 01/06/21 0419  WBC 12.0*  HGB 13.0  HCT 42.7  MCV 89.0  PLT 636*    Cardiac Enzymes: No results for input(s): CKTOTAL, CKMB, CKMBINDEX, TROPONINI in the last 168 hours.  BNP (last 3 results) Recent Labs    12/19/20 0541  BNP 83.8     ProBNP (last 3 results) No results for input(s): PROBNP in the last 8760 hours.  Radiological Exams: No results found.  Assessment/Plan Active Problems:   Acute on chronic respiratory failure with hypoxia (HCC)   COVID-19 virus infection   Severe sepsis (HCC)   Acute renal failure due to tubular necrosis (HCC)   Healthcare-associated pneumonia   Acute respiratory distress syndrome (ARDS) due to COVID-19 virus (HCC)   1. Acute on chronic respiratory failure hypoxia we will proceed to decannulation 2. COVID-19 virus infection recovery phase 3. Severe sepsis treated resolving 4. ARDS resolving 5. Healthcare associated pneumonia improved 6. Acute renal failure following up on labs improved   I have personally seen and evaluated the patient, evaluated laboratory and imaging results, formulated the assessment and plan and placed orders. The Patient requires high complexity decision making with multiple systems involvement.  Rounds were done with the Respiratory Therapy Director and Staff therapists and discussed with nursing staff also.  [name], MD [facility] Pulmonary Critical Care Medicine Sleep Medicine

## 2021-01-10 ENCOUNTER — Other Ambulatory Visit (HOSPITAL_COMMUNITY): Payer: Medicare Other

## 2021-01-10 DIAGNOSIS — J189 Pneumonia, unspecified organism: Secondary | ICD-10-CM | POA: Diagnosis not present

## 2021-01-10 DIAGNOSIS — N170 Acute kidney failure with tubular necrosis: Secondary | ICD-10-CM | POA: Diagnosis not present

## 2021-01-10 DIAGNOSIS — J9621 Acute and chronic respiratory failure with hypoxia: Secondary | ICD-10-CM | POA: Diagnosis not present

## 2021-01-10 DIAGNOSIS — U071 COVID-19: Secondary | ICD-10-CM | POA: Diagnosis not present

## 2021-01-11 NOTE — Progress Notes (Signed)
Pulmonary Critical Care Medicine [facility] [place]   PULMONARY CRITICAL CARE SERVICE  PROGRESS NOTE    Ohn Bostic.  CVE:[id number]  DOB: 18-Apr-1971   DOA: 12/02/2020  Referring Physician: Carron Curie, MD  HPI: Marguis Mathieson. is a 50 y.o. male seen for follow up of Acute on Chronic Respiratory Failure.  Patient is right now on room air was decannulated doing well  Medications: Reviewed on Rounds  Physical Exam:  Vitals: Temperature is 98.9 pulse 97 respiratory 25 blood pressure is 118/54 saturations 97%  Ventilator Settings decannulated room air  . General: Comfortable at this time . Eyes: Grossly normal lids, irises & conjunctiva . ENT: grossly tongue is normal . Neck: no obvious mass . Cardiovascular: S1 S2 normal no gallop . Respiratory: No rhonchi no rales are noted at this time . Abdomen: soft . Skin: no rash seen on limited exam . Musculoskeletal: not rigid . Psychiatric:unable to assess . Neurologic: no seizure no involuntary movements         Lab Data:   Basic Metabolic Panel: Recent Labs  Lab 01/06/21 0419 01/09/21 0246  NA 148* 146*  K 3.9 3.7  CL 108 110  CO2 32 30  GLUCOSE 143* 220*  BUN 16 18  CREATININE 0.59* 0.67  CALCIUM 9.7 9.7    ABG: No results for input(s): PHART, PCO2ART, PO2ART, HCO3, O2SAT in the last 168 hours.  Liver Function Tests: Recent Labs  Lab 01/06/21 0419  AST 19  ALT 14  ALKPHOS 126  BILITOT 0.9  PROT 7.4  ALBUMIN 2.6*   No results for input(s): LIPASE, AMYLASE in the last 168 hours. No results for input(s): AMMONIA in the last 168 hours.  CBC: Recent Labs  Lab 01/06/21 0419  WBC 12.0*  HGB 13.0  HCT 42.7  MCV 89.0  PLT 636*    Cardiac Enzymes: No results for input(s): CKTOTAL, CKMB, CKMBINDEX, TROPONINI in the last 168 hours.  BNP (last 3 results) Recent Labs    12/19/20 0541  BNP 83.8    ProBNP (last 3 results) No results for input(s): PROBNP in the last 8760  hours.  Radiological Exams: DG Chest Port 1 View  Result Date: 01/09/2021 CLINICAL DATA:  Fever. EXAM: PORTABLE CHEST 1 VIEW COMPARISON:  December 21, 2020 FINDINGS: The heart size and mediastinal contours are stable. Mild diffuse increased pulmonary interstitium is identified bilaterally. Mild patchy opacity of the lateral left mid lung is probably improved compared prior exam. There is no pleural effusion. The visualized skeletal structures are stable. IMPRESSION: Mild diffuse increased pulmonary interstitium bilaterally. This can be seen in mild pulmonary edema. Mild patchy opacity of the lateral left mid lung is probably improved compared prior exam. Electronically Signed   By: Sherian Rein M.D.   On: 01/09/2021 12:09    Assessment/Plan Active Problems:   Acute on chronic respiratory failure with hypoxia (HCC)   COVID-19 virus infection   Severe sepsis (HCC)   Acute renal failure due to tubular necrosis (HCC)   Healthcare-associated pneumonia   Acute respiratory distress syndrome (ARDS) due to COVID-19 virus (HCC)   1. Acute on chronic respiratory failure hypoxia we will continue with supportive care oxygen as needed 2. COVID-19 virus infection resolved 3. Severe sepsis treated improving 4. Acute renal failure no change 5. Healthcare associated pneumonia treated we will continue to monitor 6. ARDS slow improvement   I have personally seen and evaluated the patient, evaluated laboratory and imaging results, formulated the assessment and plan  and placed orders. The Patient requires high complexity decision making with multiple systems involvement.  Rounds were done with the Respiratory Therapy Director and Staff therapists and discussed with nursing staff also.  Allyne Gee, MD [facility] Pulmonary Critical Care Medicine Sleep Medicine

## 2021-01-12 LAB — SARS CORONAVIRUS 2 (TAT 6-24 HRS): SARS Coronavirus 2: NEGATIVE

## 2021-01-13 LAB — CBC
HCT: 38.7 % — ABNORMAL LOW (ref 39.0–52.0)
Hemoglobin: 12 g/dL — ABNORMAL LOW (ref 13.0–17.0)
MCH: 26.5 pg (ref 26.0–34.0)
MCHC: 31 g/dL (ref 30.0–36.0)
MCV: 85.6 fL (ref 80.0–100.0)
Platelets: 424 10*3/uL — ABNORMAL HIGH (ref 150–400)
RBC: 4.52 MIL/uL (ref 4.22–5.81)
RDW: 16.1 % — ABNORMAL HIGH (ref 11.5–15.5)
WBC: 10 10*3/uL (ref 4.0–10.5)
nRBC: 0 % (ref 0.0–0.2)

## 2021-01-13 LAB — BASIC METABOLIC PANEL
Anion gap: 8 (ref 5–15)
BUN: 7 mg/dL (ref 6–20)
CO2: 23 mmol/L (ref 22–32)
Calcium: 9.2 mg/dL (ref 8.9–10.3)
Chloride: 106 mmol/L (ref 98–111)
Creatinine, Ser: 0.55 mg/dL — ABNORMAL LOW (ref 0.61–1.24)
GFR, Estimated: >60 mL/min (ref >60)
Glucose, Bld: 103 mg/dL — ABNORMAL HIGH (ref 70–99)
Potassium: 3.7 mmol/L (ref 3.5–5.1)
Sodium: 137 mmol/L (ref 135–145)

## 2021-07-04 IMAGING — DX DG ABD PORTABLE 1V
1 series · 1 of 1 positions shown · non-contrast
Comparison: 12/13/2020.

CLINICAL DATA: Ileus.

EXAM:
PORTABLE ABDOMEN - 1 VIEW

[abdomen]
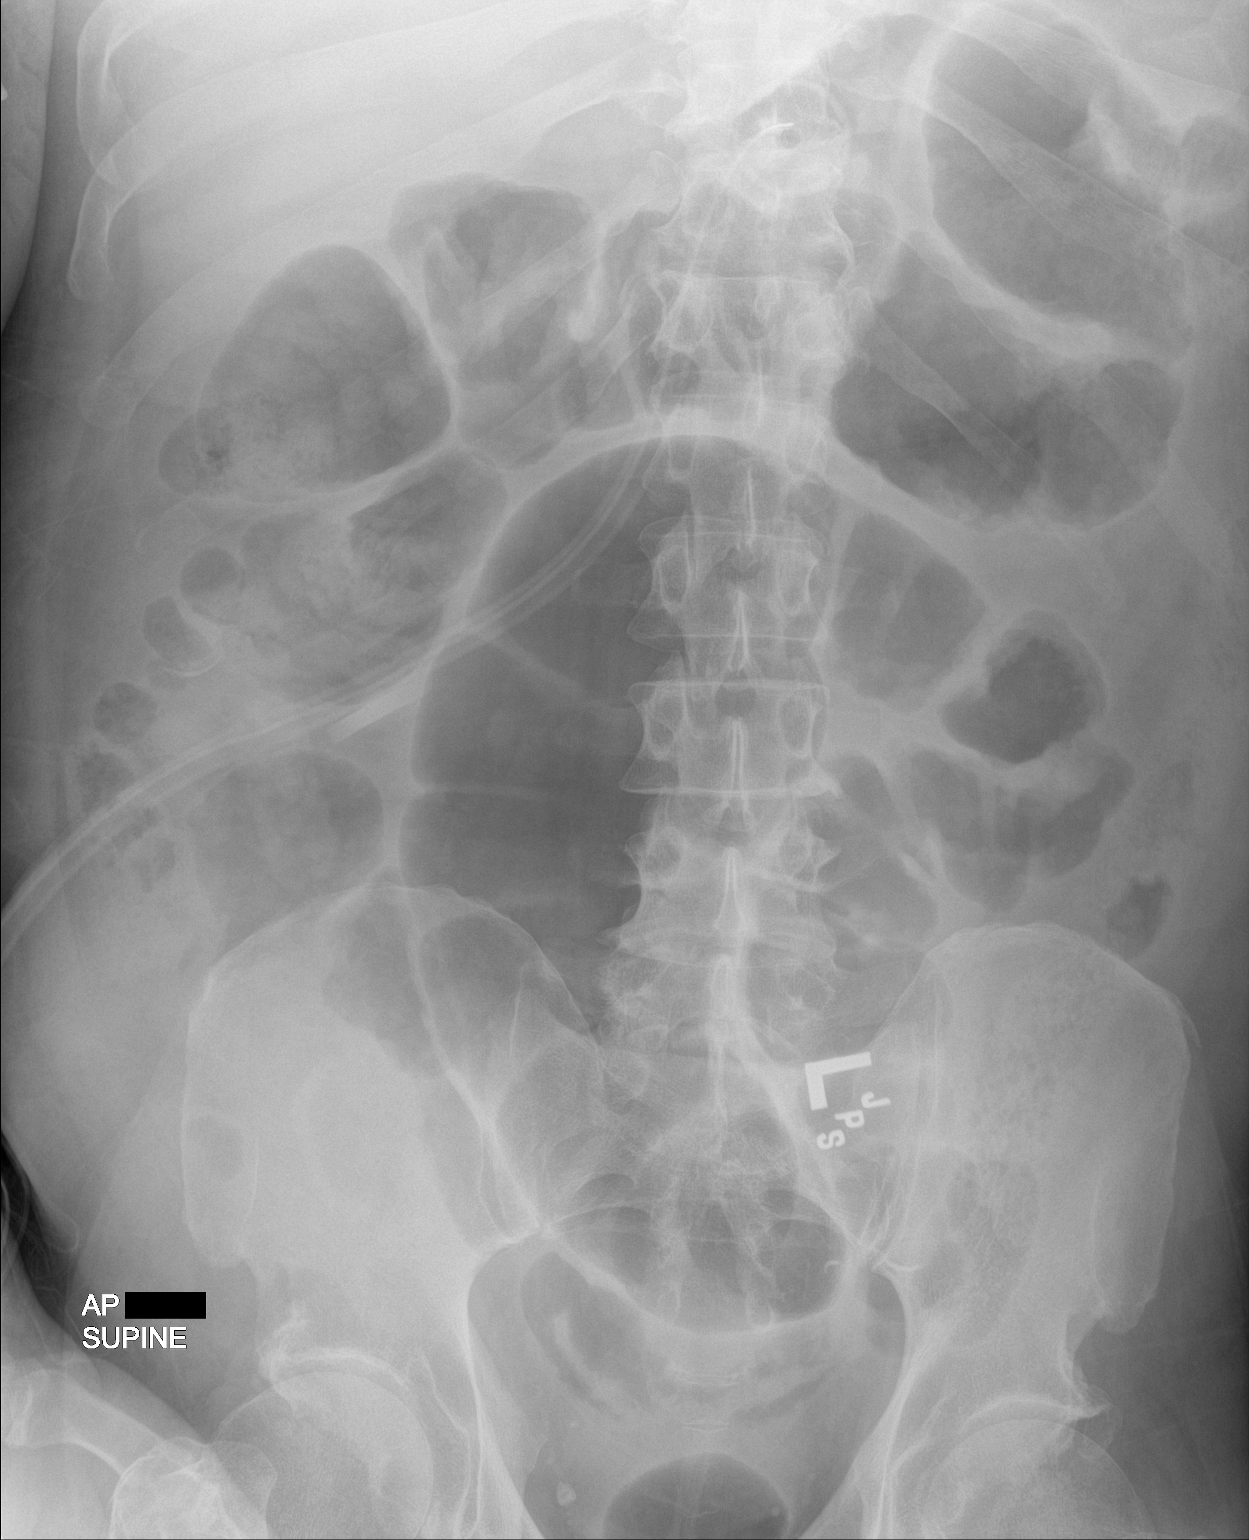

[1 of 1 positions shown; findings below may reference images not displayed]

FINDINGS: Gastrostomy tube noted over the stomach in stable position.
Persistent colonic distention again noted. Sigmoid colon is
distended up to 10 cm. Stool is noted throughout the colon. Air
noted in the rectum. Again colonic ileus could present this fashion.
Continued follow-up exam suggested to demonstrate resolution and to
exclude bowel obstruction. No free air noted. Degenerative change
lumbar spine and both hips. Pelvic calcifications consistent
phleboliths.
IMPRESSION: 1. Persistent colonic distention again noted. Sigmoid colon is
distended up to 10 cm. Stool is noted throughout the colon. Air
noted in the rectum. Again colonic ileus could present in this
fashion. Continued follow-up exam suggested to demonstrate
resolution and to exclude bowel obstruction.

2.  Gastrostomy tube in stable position.

## 2021-07-10 IMAGING — DX DG CHEST 1V PORT
1 series · 1 of 1 positions shown · non-contrast
Comparison: December 19, 2020

CLINICAL DATA: CHF.  Trach/vent.

EXAM:
PORTABLE CHEST 1 VIEW

[chest ap]
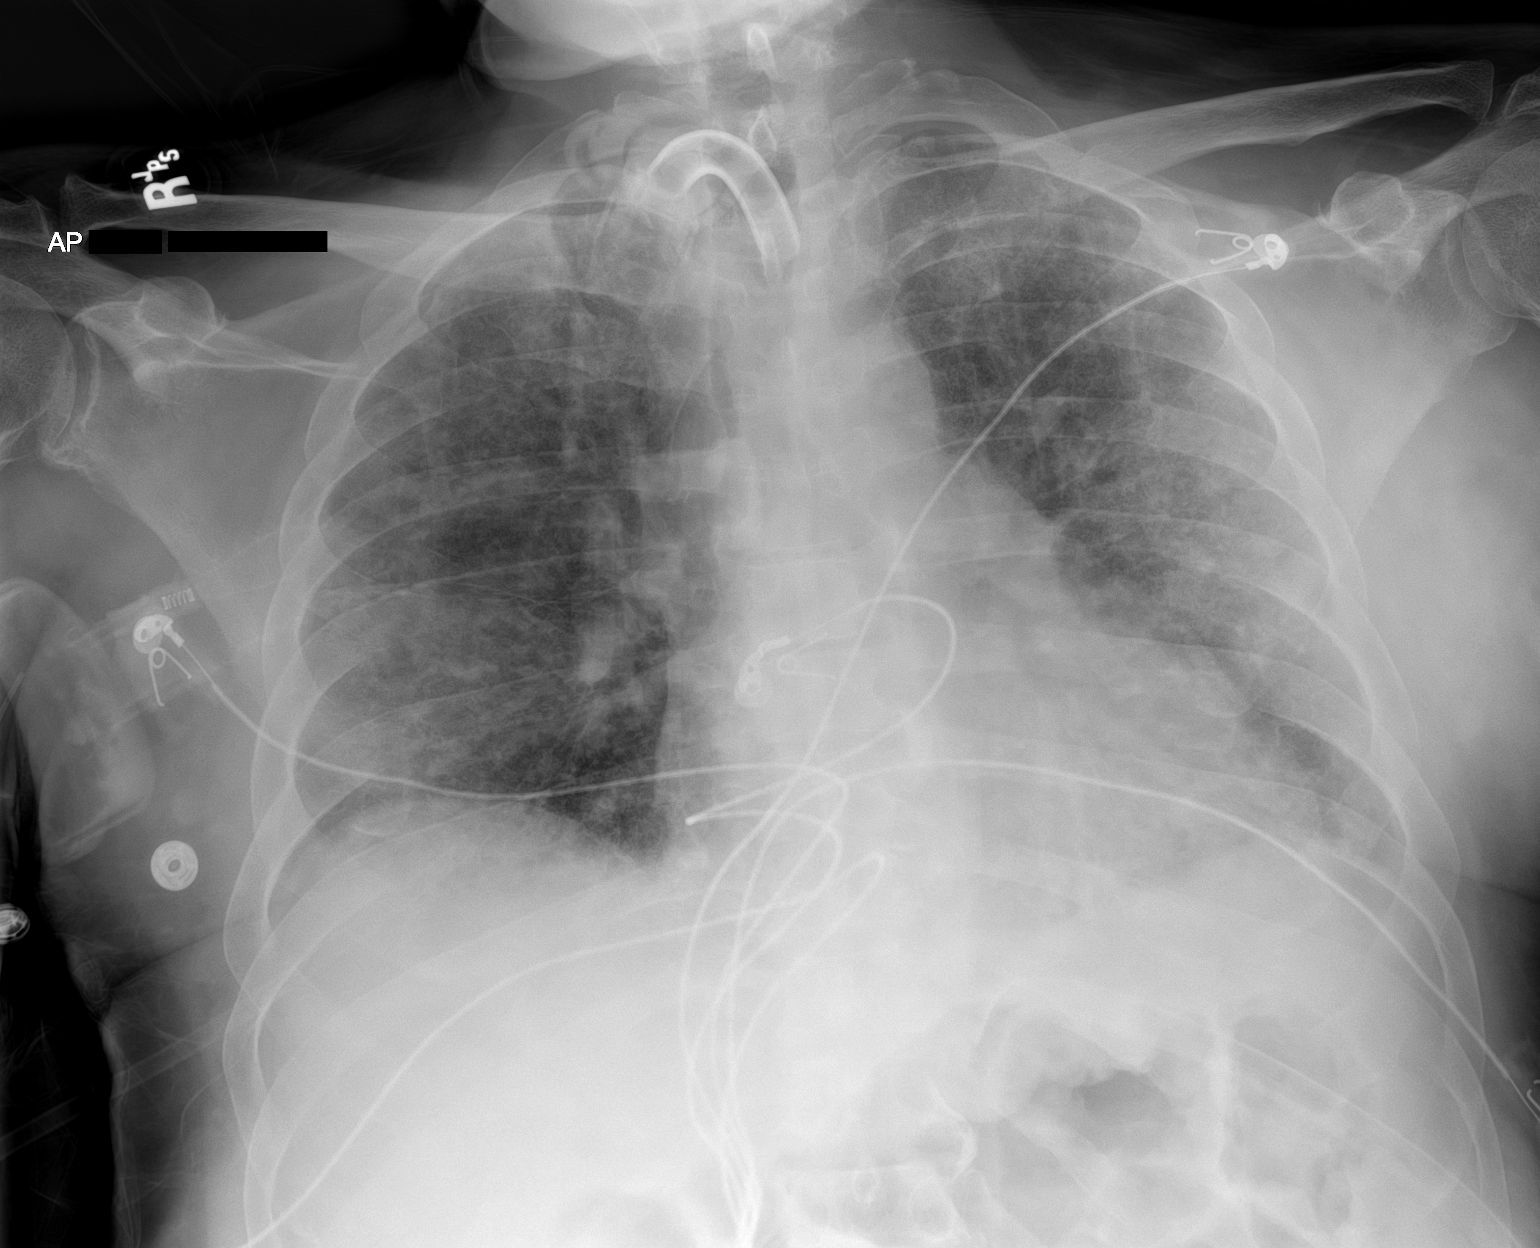

[1 of 1 positions shown; findings below may reference images not displayed]

FINDINGS: Slightly improved but persistent diffuse patchy airspace opacities.
No visible pneumothorax or pleural effusions on this single
semi-erect radiograph. Enlarged cardiac silhouette, similar to
prior. Pulmonary vascular congestion. Tracheostomy tube tip projects
midline at the level of clavicular heads.
IMPRESSION: 1. Slightly improved bilateral diffuse patchy airspace opacities,
which could represent multifocal pneumonia and/or edema.
2. Cardiomegaly and pulmonary vascular congestion
